# Patient Record
Sex: Female | Born: 1980 | Race: White | Hispanic: No | Smoking: Never smoker
Health system: Southern US, Community
[De-identification: ages and names within clinical notes are randomized; demographics above are authoritative.]

## PROBLEM LIST (undated history)

## (undated) DIAGNOSIS — F32A Depression, unspecified: Secondary | ICD-10-CM

## (undated) DIAGNOSIS — D259 Leiomyoma of uterus, unspecified: Secondary | ICD-10-CM

## (undated) HISTORY — DX: Depression, unspecified: F32.A

---

## 2016-11-02 DIAGNOSIS — T7840XA Allergy, unspecified, initial encounter: Secondary | ICD-10-CM | POA: Insufficient documentation

## 2016-11-02 DIAGNOSIS — J329 Chronic sinusitis, unspecified: Secondary | ICD-10-CM | POA: Insufficient documentation

## 2018-05-19 DIAGNOSIS — H532 Diplopia: Secondary | ICD-10-CM | POA: Insufficient documentation

## 2018-10-19 DIAGNOSIS — N939 Abnormal uterine and vaginal bleeding, unspecified: Secondary | ICD-10-CM | POA: Insufficient documentation

## 2018-11-03 HISTORY — PX: MYOMECTOMY: SHX85

## 2018-11-21 DIAGNOSIS — F419 Anxiety disorder, unspecified: Secondary | ICD-10-CM | POA: Diagnosis not present

## 2018-11-28 DIAGNOSIS — F419 Anxiety disorder, unspecified: Secondary | ICD-10-CM | POA: Diagnosis not present

## 2018-12-08 DIAGNOSIS — Z23 Encounter for immunization: Secondary | ICD-10-CM | POA: Diagnosis not present

## 2018-12-21 DIAGNOSIS — F419 Anxiety disorder, unspecified: Secondary | ICD-10-CM | POA: Diagnosis not present

## 2018-12-25 DIAGNOSIS — M9902 Segmental and somatic dysfunction of thoracic region: Secondary | ICD-10-CM | POA: Diagnosis not present

## 2018-12-25 DIAGNOSIS — M9903 Segmental and somatic dysfunction of lumbar region: Secondary | ICD-10-CM | POA: Diagnosis not present

## 2018-12-25 DIAGNOSIS — M9905 Segmental and somatic dysfunction of pelvic region: Secondary | ICD-10-CM | POA: Diagnosis not present

## 2018-12-25 DIAGNOSIS — M62830 Muscle spasm of back: Secondary | ICD-10-CM | POA: Diagnosis not present

## 2018-12-29 DIAGNOSIS — M9902 Segmental and somatic dysfunction of thoracic region: Secondary | ICD-10-CM | POA: Diagnosis not present

## 2018-12-29 DIAGNOSIS — M9903 Segmental and somatic dysfunction of lumbar region: Secondary | ICD-10-CM | POA: Diagnosis not present

## 2018-12-29 DIAGNOSIS — M9905 Segmental and somatic dysfunction of pelvic region: Secondary | ICD-10-CM | POA: Diagnosis not present

## 2018-12-29 DIAGNOSIS — M62830 Muscle spasm of back: Secondary | ICD-10-CM | POA: Diagnosis not present

## 2019-01-08 DIAGNOSIS — R7989 Other specified abnormal findings of blood chemistry: Secondary | ICD-10-CM | POA: Diagnosis not present

## 2019-01-08 DIAGNOSIS — J329 Chronic sinusitis, unspecified: Secondary | ICD-10-CM | POA: Diagnosis not present

## 2019-01-14 ENCOUNTER — Emergency Department (HOSPITAL_COMMUNITY)
Admission: EM | Admit: 2019-01-14 | Discharge: 2019-01-15 | Disposition: A | Discharge status: Home / Self Care | Payer: BC Managed Care – PPO | Attending: Emergency Medicine | Admitting: Emergency Medicine

## 2019-01-14 DIAGNOSIS — Z79899 Other long term (current) drug therapy: Secondary | ICD-10-CM | POA: Diagnosis not present

## 2019-01-14 DIAGNOSIS — N939 Abnormal uterine and vaginal bleeding, unspecified: Secondary | ICD-10-CM | POA: Insufficient documentation

## 2019-01-14 HISTORY — DX: Leiomyoma of uterus, unspecified: D25.9

## 2019-01-15 ENCOUNTER — Other Ambulatory Visit: Payer: Self-pay

## 2019-01-15 ENCOUNTER — Encounter (HOSPITAL_COMMUNITY): Payer: Self-pay | Admitting: Emergency Medicine

## 2019-01-15 LAB — CBC WITH DIFFERENTIAL/PLATELET
Abs Immature Granulocytes: 0.01 10*3/uL (ref 0.00–0.07)
Basophils Absolute: 0 10*3/uL (ref 0.0–0.1)
Basophils Relative: 1 %
Eosinophils Absolute: 0 10*3/uL (ref 0.0–0.5)
Eosinophils Relative: 0 %
HCT: 32 % — ABNORMAL LOW (ref 36.0–46.0)
Hemoglobin: 10.6 g/dL — ABNORMAL LOW (ref 12.0–15.0)
Immature Granulocytes: 0 %
Lymphocytes Relative: 46 %
Lymphs Abs: 3.8 10*3/uL (ref 0.7–4.0)
MCH: 30.9 pg (ref 26.0–34.0)
MCHC: 33.1 g/dL (ref 30.0–36.0)
MCV: 93.3 fL (ref 80.0–100.0)
Monocytes Absolute: 0.7 10*3/uL (ref 0.1–1.0)
Monocytes Relative: 8 %
Neutro Abs: 3.6 10*3/uL (ref 1.7–7.7)
Neutrophils Relative %: 45 %
Platelets: 299 10*3/uL (ref 150–400)
RBC: 3.43 MIL/uL — ABNORMAL LOW (ref 3.87–5.11)
RDW: 12.6 % (ref 11.5–15.5)
WBC: 8.1 10*3/uL (ref 4.0–10.5)
nRBC: 0 % (ref 0.0–0.2)

## 2019-01-15 LAB — BASIC METABOLIC PANEL
Anion gap: 9 (ref 5–15)
BUN: 15 mg/dL (ref 6–20)
CO2: 22 mmol/L (ref 22–32)
Calcium: 8.7 mg/dL — ABNORMAL LOW (ref 8.9–10.3)
Chloride: 107 mmol/L (ref 98–111)
Creatinine, Ser: 0.8 mg/dL (ref 0.44–1.00)
GFR calc Af Amer: >60 mL/min (ref >60)
GFR calc non Af Amer: >60 mL/min (ref >60)
Glucose, Bld: 125 mg/dL — ABNORMAL HIGH (ref 70–99)
Potassium: 3.7 mmol/L (ref 3.5–5.1)
Sodium: 138 mmol/L (ref 135–145)

## 2019-01-15 LAB — ABO/RH: ABO/RH(D): A POS

## 2019-01-15 LAB — TYPE AND SCREEN
ABO/RH(D): A POS
Antibody Screen: NEGATIVE

## 2019-01-15 LAB — HEMOGLOBIN AND HEMATOCRIT, BLOOD
HCT: 31.9 % — ABNORMAL LOW (ref 36.0–46.0)
Hemoglobin: 10.6 g/dL — ABNORMAL LOW (ref 12.0–15.0)

## 2019-01-15 LAB — I-STAT BETA HCG BLOOD, ED (MC, WL, AP ONLY): I-stat hCG, quantitative: <5 m[IU]/mL (ref <5)

## 2019-01-15 NOTE — ED Triage Notes (Signed)
Per pt she had fibroids removed on August 28th and has been having her period pretty much since then,Pt is now saying she was soaking pads in 5 mins and abdominal pain. Pt said passing huge clots and very big. Pt also having chills

## 2019-01-15 NOTE — ED Notes (Signed)
Pt is shaken as she states that the amt of blood coming out is a lot and is scaring her

## 2019-01-15 NOTE — ED Notes (Signed)
Patient verbalizes understanding of discharge instructions. Opportunity for questioning and answers were provided. Armband removed by staff, pt discharged from ED. Ambulated out to lobby  

## 2019-01-15 NOTE — Discharge Instructions (Addendum)
Please follow up with your OBGYN, as planned. Call their office with any changes.   Return to the emergency department for dizziness, passing out, chest pain, shortness of breath, bleeding through a pad or more an hour, or any other major concerns.

## 2019-01-15 NOTE — ED Provider Notes (Signed)
[facility] EMERGENCY DEPARTMENT Provider Note   CSN: [id number] Arrival date & time: 01/14/19  2351     History   Chief Complaint Chief Complaint  Patient presents with  . Metrorrhagia    fibroids    HPI Kristy Benton is a 38 y.o. female.     HPI   Kristy Benton is a 38 y.o. female, with a history of uterine fibroids and myomectomy, presenting to the ED with vaginal bleeding for the last month, but increased in amount last night. Last night she began soaking a pad in about 5 minutes. Since about 1 AM this morning, the bleeding slowed significantly and then resolved. Bleeding has been accompanied by lower abdominal cramping, but this has resolved. Accompanied by nausea, generalized weakness, and "shakiness," all of which have resolved.  Denies anticoagulation. Underwent fibroid removal Nov 03, 2018 by Dr. Sedalia Muta with [facility]. She has an appointment with Dr. Davina Poke tomorrow.   Denies fever/chills, syncope, vomiting, chest pain, shortness of breath, or any other complaints.     Past Medical History:  Diagnosis Date  . Uterine fibroid     There are no active problems to display for this patient.   Past Surgical History:  Procedure Laterality Date  . MYOMECTOMY  11/03/2018     OB History   No obstetric history on file.      Home Medications    Prior to Admission medications   Medication Sig Start Date End Date Taking? Authorizing Provider  fexofenadine (ALLEGRA) 180 MG tablet Take 180 mg by mouth daily.   Yes [provider]  fluticasone (FLONASE) 50 MCG/ACT nasal spray Place 2 sprays into both nostrils daily.   Yes [provider]  montelukast (SINGULAIR) 10 MG tablet Take 10 mg by mouth at bedtime.   Yes [provider]  norgestimate-ethinyl estradiol (ORTHO-CYCLEN) 0.25-35 MG-MCG tablet Take 1 tablet by mouth daily.   Yes [provider]    Family History History reviewed. No pertinent  family history.  Social History Social History   Tobacco Use  . Smoking status: Never Smoker  . Smokeless tobacco: Never Used  Substance Use Topics  . Alcohol use: Not on file  . Drug use: Not on file     Allergies   Amoxicillin   Review of Systems Review of Systems  Constitutional: Positive for fatigue. Negative for chills, diaphoresis and fever.  Respiratory: Negative for shortness of breath.   Cardiovascular: Negative for chest pain.  Gastrointestinal: Positive for abdominal pain (resolved) and nausea (resolved). Negative for diarrhea and vomiting.  Genitourinary: Positive for vaginal bleeding.  Neurological: Positive for weakness (resolved). Negative for syncope.  All other systems reviewed and are negative.    Physical Exam Updated Vital Signs BP 104/71 (BP Location: Left Arm)   Pulse 69   Temp 98.4 F (36.9 C) (Oral)   Resp 18   SpO2 98%   Physical Exam Vitals signs and nursing note reviewed.  Constitutional:      General: She is not in acute distress.    Appearance: She is well-developed. She is not diaphoretic.  HENT:     Head: Normocephalic and atraumatic.     Mouth/Throat:     Mouth: Mucous membranes are moist.     Pharynx: Oropharynx is clear.  Eyes:     Conjunctiva/sclera: Conjunctivae normal.  Neck:     Musculoskeletal: Neck supple.  Cardiovascular:     Rate and Rhythm: Normal rate and regular rhythm.  Pulses: Normal pulses.          Radial pulses are 2+ on the right side and 2+ on the left side.       Posterior tibial pulses are 2+ on the right side and 2+ on the left side.     Heart sounds: Normal heart sounds.     Comments: Tactile temperature in the extremities appropriate and equal bilaterally. Pulmonary:     Effort: Pulmonary effort is normal. No respiratory distress.     Breath sounds: Normal breath sounds.  Abdominal:     Palpations: Abdomen is soft.     Tenderness: There is no abdominal tenderness. There is no guarding.   Musculoskeletal:     Right lower leg: No edema.     Left lower leg: No edema.  Lymphadenopathy:     Cervical: No cervical adenopathy.  Skin:    General: Skin is warm and dry.  Neurological:     Mental Status: She is alert.  Psychiatric:        Mood and Affect: Mood and affect normal.        Speech: Speech normal.        Behavior: Behavior normal.      ED Treatments / Results  Labs (all labs ordered are listed, but only abnormal results are displayed) Labs Reviewed  CBC WITH DIFFERENTIAL/PLATELET - Abnormal; Notable for the following components:      Result Value   RBC 3.43 (*)    Hemoglobin 10.6 (*)    HCT 32.0 (*)    All other components within normal limits  BASIC METABOLIC PANEL - Abnormal; Notable for the following components:   Glucose, Bld 125 (*)    Calcium 8.7 (*)    All other components within normal limits  HEMOGLOBIN AND HEMATOCRIT, BLOOD - Abnormal; Notable for the following components:   Hemoglobin 10.6 (*)    HCT 31.9 (*)    All other components within normal limits  I-STAT BETA HCG BLOOD, ED ([facility], [facility], [facility] ONLY)  TYPE AND SCREEN  ABO/RH    EKG None  Radiology No results found.  Procedures Procedures (including critical care time)  Medications Ordered in ED Medications - No data to display   Initial Impression / Assessment and Plan / ED Course  I have reviewed the triage vital signs and the nursing notes.  Pertinent labs & imaging results that were available during my care of the patient were reviewed by me and considered in my medical decision making (see chart for details).  Clinical Course as of Jan 14 1030  [facility] Jan 15, 2019  0857 Noted to be 12.8 on Oct 19, 2018 through [place].  Hemoglobin(!): 10.6 [SJ]  0954 Spoke with Maurine, RN with Dr. Vivia Ewing office (patient's OBGYN). Dr. Davina Poke does not come in until around 1 pm today.  She states she will forward the information about the patient's visit to the provider team.    [SJ]     Clinical Course User Index [SJ] Joy, Shawn C, PA-C       Patient presents with vaginal bleeding over the last month, worse last night. Patient is nontoxic appearing, afebrile, not tachycardic, not tachypneic, not hypotensive, maintains excellent SPO2 on room air, and is in no apparent distress.  Pregnancy test negative. Hemoglobin 10.6 today, previously 12.8, however, patient is currently asymptomatic. I explained the purpose of pelvic exam to the patient, but she declined. She has close follow-up with OB/GYN already scheduled. Return precautions discussed.  Patient voices understanding of these instructions, accepts the plan, and is comfortable with discharge.  Findings and plan of care discussed with Octaviano Glow, MD.   Vitals:   01/15/19 0845 01/15/19 0900 01/15/19 0915 01/15/19 0943  BP: 106/68 117/70 120/64 107/65  Pulse: 62 72 79 70  Resp:    17  Temp:    98.7 F (37.1 C)  TempSrc:    Oral  SpO2: 99% 100% 100% 100%     Final Clinical Impressions(s) / ED Diagnoses   Final diagnoses:  Vaginal bleeding    ED Discharge Orders    None       Layla Maw 01/15/19 1033    Wyvonnia Dusky, MD 01/17/19 1217

## 2019-01-16 DIAGNOSIS — D259 Leiomyoma of uterus, unspecified: Secondary | ICD-10-CM | POA: Diagnosis not present

## 2019-01-16 DIAGNOSIS — N939 Abnormal uterine and vaginal bleeding, unspecified: Secondary | ICD-10-CM | POA: Diagnosis not present

## 2019-02-05 DIAGNOSIS — Z20828 Contact with and (suspected) exposure to other viral communicable diseases: Secondary | ICD-10-CM | POA: Diagnosis not present

## 2019-02-09 DIAGNOSIS — N8501 Benign endometrial hyperplasia: Secondary | ICD-10-CM | POA: Diagnosis not present

## 2019-02-09 DIAGNOSIS — N938 Other specified abnormal uterine and vaginal bleeding: Secondary | ICD-10-CM | POA: Diagnosis not present

## 2019-02-09 DIAGNOSIS — N939 Abnormal uterine and vaginal bleeding, unspecified: Secondary | ICD-10-CM | POA: Diagnosis not present

## 2019-02-09 DIAGNOSIS — D259 Leiomyoma of uterus, unspecified: Secondary | ICD-10-CM | POA: Diagnosis not present

## 2019-02-09 DIAGNOSIS — N8500 Endometrial hyperplasia, unspecified: Secondary | ICD-10-CM | POA: Diagnosis not present

## 2019-02-09 DIAGNOSIS — D250 Submucous leiomyoma of uterus: Secondary | ICD-10-CM | POA: Diagnosis not present

## 2019-02-09 DIAGNOSIS — N72 Inflammatory disease of cervix uteri: Secondary | ICD-10-CM | POA: Diagnosis not present

## 2019-02-10 DIAGNOSIS — D259 Leiomyoma of uterus, unspecified: Secondary | ICD-10-CM | POA: Diagnosis not present

## 2019-02-10 DIAGNOSIS — N939 Abnormal uterine and vaginal bleeding, unspecified: Secondary | ICD-10-CM | POA: Diagnosis not present

## 2019-02-14 DIAGNOSIS — F419 Anxiety disorder, unspecified: Secondary | ICD-10-CM | POA: Diagnosis not present

## 2019-03-05 DIAGNOSIS — M9903 Segmental and somatic dysfunction of lumbar region: Secondary | ICD-10-CM | POA: Diagnosis not present

## 2019-03-05 DIAGNOSIS — M62830 Muscle spasm of back: Secondary | ICD-10-CM | POA: Diagnosis not present

## 2019-03-05 DIAGNOSIS — M9902 Segmental and somatic dysfunction of thoracic region: Secondary | ICD-10-CM | POA: Diagnosis not present

## 2019-03-05 DIAGNOSIS — M9905 Segmental and somatic dysfunction of pelvic region: Secondary | ICD-10-CM | POA: Diagnosis not present

## 2019-03-08 DIAGNOSIS — M9905 Segmental and somatic dysfunction of pelvic region: Secondary | ICD-10-CM | POA: Diagnosis not present

## 2019-03-08 DIAGNOSIS — M9902 Segmental and somatic dysfunction of thoracic region: Secondary | ICD-10-CM | POA: Diagnosis not present

## 2019-03-08 DIAGNOSIS — M62830 Muscle spasm of back: Secondary | ICD-10-CM | POA: Diagnosis not present

## 2019-03-08 DIAGNOSIS — M9903 Segmental and somatic dysfunction of lumbar region: Secondary | ICD-10-CM | POA: Diagnosis not present

## 2019-06-12 DIAGNOSIS — D225 Melanocytic nevi of trunk: Secondary | ICD-10-CM | POA: Diagnosis not present

## 2019-06-12 DIAGNOSIS — D2272 Melanocytic nevi of left lower limb, including hip: Secondary | ICD-10-CM | POA: Diagnosis not present

## 2019-06-12 DIAGNOSIS — L7211 Pilar cyst: Secondary | ICD-10-CM | POA: Diagnosis not present

## 2019-06-12 DIAGNOSIS — L718 Other rosacea: Secondary | ICD-10-CM | POA: Diagnosis not present

## 2019-06-25 DIAGNOSIS — J3089 Other allergic rhinitis: Secondary | ICD-10-CM | POA: Diagnosis not present

## 2019-06-25 DIAGNOSIS — H1045 Other chronic allergic conjunctivitis: Secondary | ICD-10-CM | POA: Diagnosis not present

## 2019-06-25 DIAGNOSIS — J301 Allergic rhinitis due to pollen: Secondary | ICD-10-CM | POA: Diagnosis not present

## 2019-10-22 DIAGNOSIS — Z0000 Encounter for general adult medical examination without abnormal findings: Secondary | ICD-10-CM | POA: Diagnosis not present

## 2019-10-22 DIAGNOSIS — R350 Frequency of micturition: Secondary | ICD-10-CM | POA: Diagnosis not present

## 2019-10-22 DIAGNOSIS — F419 Anxiety disorder, unspecified: Secondary | ICD-10-CM | POA: Diagnosis not present

## 2019-10-22 DIAGNOSIS — F329 Major depressive disorder, single episode, unspecified: Secondary | ICD-10-CM | POA: Diagnosis not present

## 2019-10-24 DIAGNOSIS — R350 Frequency of micturition: Secondary | ICD-10-CM | POA: Diagnosis not present

## 2019-11-09 DIAGNOSIS — D2271 Melanocytic nevi of right lower limb, including hip: Secondary | ICD-10-CM | POA: Diagnosis not present

## 2019-11-09 DIAGNOSIS — L718 Other rosacea: Secondary | ICD-10-CM | POA: Diagnosis not present

## 2019-11-22 DIAGNOSIS — F419 Anxiety disorder, unspecified: Secondary | ICD-10-CM | POA: Diagnosis not present

## 2019-12-18 DIAGNOSIS — F419 Anxiety disorder, unspecified: Secondary | ICD-10-CM | POA: Diagnosis not present

## 2019-12-24 DIAGNOSIS — F419 Anxiety disorder, unspecified: Secondary | ICD-10-CM | POA: Diagnosis not present

## 2020-02-15 DIAGNOSIS — L0109 Other impetigo: Secondary | ICD-10-CM | POA: Diagnosis not present

## 2020-02-15 DIAGNOSIS — D1801 Hemangioma of skin and subcutaneous tissue: Secondary | ICD-10-CM | POA: Diagnosis not present

## 2020-04-04 DIAGNOSIS — D485 Neoplasm of uncertain behavior of skin: Secondary | ICD-10-CM | POA: Diagnosis not present

## 2020-04-04 DIAGNOSIS — L298 Other pruritus: Secondary | ICD-10-CM | POA: Diagnosis not present

## 2020-04-21 DIAGNOSIS — R2231 Localized swelling, mass and lump, right upper limb: Secondary | ICD-10-CM | POA: Diagnosis not present

## 2020-04-21 DIAGNOSIS — M79644 Pain in right finger(s): Secondary | ICD-10-CM | POA: Insufficient documentation

## 2020-04-23 ENCOUNTER — Other Ambulatory Visit: Payer: Self-pay | Admitting: Orthopedic Surgery

## 2020-04-23 DIAGNOSIS — R2231 Localized swelling, mass and lump, right upper limb: Secondary | ICD-10-CM

## 2020-04-24 DIAGNOSIS — Z1231 Encounter for screening mammogram for malignant neoplasm of breast: Secondary | ICD-10-CM | POA: Diagnosis not present

## 2020-05-02 ENCOUNTER — Ambulatory Visit
Admission: RE | Admit: 2020-05-02 | Discharge: 2020-05-02 | Disposition: A | Discharge status: Home / Self Care | Payer: BC Managed Care – PPO | Source: Ambulatory Visit | Attending: Orthopedic Surgery | Admitting: Orthopedic Surgery

## 2020-05-02 ENCOUNTER — Other Ambulatory Visit: Payer: Self-pay

## 2020-05-02 DIAGNOSIS — R2231 Localized swelling, mass and lump, right upper limb: Secondary | ICD-10-CM

## 2020-05-07 DIAGNOSIS — R2231 Localized swelling, mass and lump, right upper limb: Secondary | ICD-10-CM | POA: Diagnosis not present

## 2020-05-29 DIAGNOSIS — Z01419 Encounter for gynecological examination (general) (routine) without abnormal findings: Secondary | ICD-10-CM | POA: Diagnosis not present

## 2020-05-30 DIAGNOSIS — R2231 Localized swelling, mass and lump, right upper limb: Secondary | ICD-10-CM | POA: Diagnosis not present

## 2020-06-10 DIAGNOSIS — J3089 Other allergic rhinitis: Secondary | ICD-10-CM | POA: Diagnosis not present

## 2020-06-10 DIAGNOSIS — H1045 Other chronic allergic conjunctivitis: Secondary | ICD-10-CM | POA: Diagnosis not present

## 2020-06-10 DIAGNOSIS — J301 Allergic rhinitis due to pollen: Secondary | ICD-10-CM | POA: Diagnosis not present

## 2020-08-19 DIAGNOSIS — D485 Neoplasm of uncertain behavior of skin: Secondary | ICD-10-CM | POA: Diagnosis not present

## 2020-08-19 DIAGNOSIS — B078 Other viral warts: Secondary | ICD-10-CM | POA: Diagnosis not present

## 2020-08-19 DIAGNOSIS — L7211 Pilar cyst: Secondary | ICD-10-CM | POA: Diagnosis not present

## 2020-08-19 DIAGNOSIS — D2262 Melanocytic nevi of left upper limb, including shoulder: Secondary | ICD-10-CM | POA: Diagnosis not present

## 2020-08-19 DIAGNOSIS — L812 Freckles: Secondary | ICD-10-CM | POA: Diagnosis not present

## 2020-08-19 DIAGNOSIS — D2261 Melanocytic nevi of right upper limb, including shoulder: Secondary | ICD-10-CM | POA: Diagnosis not present

## 2020-08-21 DIAGNOSIS — M542 Cervicalgia: Secondary | ICD-10-CM | POA: Diagnosis not present

## 2020-08-25 DIAGNOSIS — M542 Cervicalgia: Secondary | ICD-10-CM | POA: Diagnosis not present

## 2020-08-27 DIAGNOSIS — F32A Depression, unspecified: Secondary | ICD-10-CM | POA: Diagnosis not present

## 2020-08-27 DIAGNOSIS — F330 Major depressive disorder, recurrent, mild: Secondary | ICD-10-CM | POA: Diagnosis not present

## 2020-08-27 DIAGNOSIS — F419 Anxiety disorder, unspecified: Secondary | ICD-10-CM | POA: Diagnosis not present

## 2020-09-01 DIAGNOSIS — H9202 Otalgia, left ear: Secondary | ICD-10-CM | POA: Diagnosis not present

## 2020-09-02 DIAGNOSIS — H60392 Other infective otitis externa, left ear: Secondary | ICD-10-CM | POA: Diagnosis not present

## 2020-09-09 DIAGNOSIS — M542 Cervicalgia: Secondary | ICD-10-CM | POA: Diagnosis not present

## 2020-09-25 DIAGNOSIS — J329 Chronic sinusitis, unspecified: Secondary | ICD-10-CM | POA: Diagnosis not present

## 2020-10-09 DIAGNOSIS — M542 Cervicalgia: Secondary | ICD-10-CM | POA: Diagnosis not present

## 2020-10-17 DIAGNOSIS — M542 Cervicalgia: Secondary | ICD-10-CM | POA: Diagnosis not present

## 2020-10-21 ENCOUNTER — Other Ambulatory Visit: Payer: Self-pay

## 2020-10-21 ENCOUNTER — Encounter: Payer: Self-pay | Admitting: Internal Medicine

## 2020-10-21 ENCOUNTER — Ambulatory Visit (INDEPENDENT_AMBULATORY_CARE_PROVIDER_SITE_OTHER): Payer: BC Managed Care – PPO | Admitting: Internal Medicine

## 2020-10-21 VITALS — BP 110/70 | HR 63 | Temp 98.0°F | Ht 68.0 in | Wt 142.4 lb

## 2020-10-21 DIAGNOSIS — F339 Major depressive disorder, recurrent, unspecified: Secondary | ICD-10-CM | POA: Diagnosis not present

## 2020-10-21 DIAGNOSIS — Z0000 Encounter for general adult medical examination without abnormal findings: Secondary | ICD-10-CM

## 2020-10-21 LAB — COMPREHENSIVE METABOLIC PANEL
ALT: 23 U/L (ref 0–35)
AST: 20 U/L (ref 0–37)
Albumin: 4.4 g/dL (ref 3.5–5.2)
Alkaline Phosphatase: 61 U/L (ref 39–117)
BUN: 14 mg/dL (ref 6–23)
CO2: 27 mEq/L (ref 19–32)
Calcium: 9.1 mg/dL (ref 8.4–10.5)
Chloride: 106 mEq/L (ref 96–112)
Creatinine, Ser: 0.85 mg/dL (ref 0.40–1.20)
GFR: 85.32 mL/min (ref >60.00)
Glucose, Bld: 83 mg/dL (ref 70–99)
Potassium: 4.2 mEq/L (ref 3.5–5.1)
Sodium: 140 mEq/L (ref 135–145)
Total Bilirubin: 0.6 mg/dL (ref 0.2–1.2)
Total Protein: 6.7 g/dL (ref 6.0–8.3)

## 2020-10-21 LAB — LIPID PANEL
Cholesterol: 163 mg/dL (ref 0–200)
HDL: 64.8 mg/dL (ref >39.00)
LDL Cholesterol: 90 mg/dL (ref 0–99)
NonHDL: 98.63
Total CHOL/HDL Ratio: 3
Triglycerides: 44 mg/dL (ref 0.0–149.0)
VLDL: 8.8 mg/dL (ref 0.0–40.0)

## 2020-10-21 LAB — CBC WITH DIFFERENTIAL/PLATELET
Basophils Absolute: 0.1 10*3/uL (ref 0.0–0.1)
Basophils Relative: 1.2 % (ref 0.0–3.0)
Eosinophils Absolute: 0 10*3/uL (ref 0.0–0.7)
Eosinophils Relative: 0.2 % (ref 0.0–5.0)
HCT: 38.6 % (ref 36.0–46.0)
Hemoglobin: 13 g/dL (ref 12.0–15.0)
Lymphocytes Relative: 34.8 % (ref 12.0–46.0)
Lymphs Abs: 1.5 10*3/uL (ref 0.7–4.0)
MCHC: 33.6 g/dL (ref 30.0–36.0)
MCV: 92.6 fl (ref 78.0–100.0)
Monocytes Absolute: 0.3 10*3/uL (ref 0.1–1.0)
Monocytes Relative: 7.3 % (ref 3.0–12.0)
Neutro Abs: 2.5 10*3/uL (ref 1.4–7.7)
Neutrophils Relative %: 56.5 % (ref 43.0–77.0)
Platelets: 264 10*3/uL (ref 150.0–400.0)
RBC: 4.17 Mil/uL (ref 3.87–5.11)
RDW: 13.9 % (ref 11.5–15.5)
WBC: 4.4 10*3/uL (ref 4.0–10.5)

## 2020-10-21 LAB — VITAMIN B12: Vitamin B-12: 256 pg/mL (ref 211–911)

## 2020-10-21 LAB — VITAMIN D 25 HYDROXY (VIT D DEFICIENCY, FRACTURES): VITD: 46.5 ng/mL (ref 30.00–100.00)

## 2020-10-21 LAB — HEMOGLOBIN A1C: Hgb A1c MFr Bld: 5.3 % (ref 4.6–6.5)

## 2020-10-21 LAB — TSH: TSH: 1.66 u[IU]/mL (ref 0.35–5.50)

## 2020-10-21 NOTE — Patient Instructions (Signed)
-  Nice seeing you today!!  -Lab work today; will notify you once results are available.  -Consider getting your COVID booster.  -Schedule follow up in 1 year or sooner as needed.

## 2020-10-21 NOTE — Progress Notes (Signed)
Established Patient Office Visit     This visit occurred during the SARS-CoV-2 public health emergency.  Safety protocols were in place, including screening questions prior to the visit, additional usage of staff PPE, and extensive cleaning of exam room while observing appropriate contact time as indicated for disinfecting solutions.    CC/Reason for Visit: Establish care, annual preventive exam  HPI: Kristy Benton is a 40 y.o. female who is coming in today for the above mentioned reasons. Past Medical History is significant for: Depression with stable mood on Wellbutrin.  Allergies followed by local allergist on Allegra, Singulair, Flonase.  She had a hysterectomy in December 2020 due to dysfunctional uterine bleeding.  She has routine eye and dental care.  She exercises routinely.  She works as a Psychologist, forensic for [employer].  She has a 78-year-old daughter.  She does not smoke, she drinks only occasionally.  She has allergies to amoxicillin which cause hives, her family history significant for paternal grandfather with coronary artery disease.  She has no acute concerns today.  She had a negative mammogram in February.  She had 1 [employer] COVID-vaccine, not yet boosted.   Past Medical/Surgical History: Past Medical History:  Diagnosis Date   Depression    Uterine fibroid     Past Surgical History:  Procedure Laterality Date   MYOMECTOMY  11/03/2018    Social History:  reports that she has never smoked. She has never used smokeless tobacco. She reports current alcohol use. She reports that she does not use drugs.  Allergies: Allergies  Allergen Reactions   Amoxicillin Hives    Family History:  Family History  Problem Relation Age of Onset   Coronary artery disease Paternal Grandfather      Current Outpatient Medications:    buPROPion (WELLBUTRIN XL) 150 MG 24 hr tablet, Take 150 mg by mouth daily., Disp: , Rfl:    Cholecalciferol (VITAMIN D) 50 MCG  (2000 UT) tablet, Take 2,000 Units by mouth daily., Disp: , Rfl:    ferrous sulfate 325 (65 FE) MG tablet, Take 325 mg by mouth every other day., Disp: , Rfl:    fexofenadine (ALLEGRA) 180 MG tablet, Take 180 mg by mouth daily., Disp: , Rfl:    fluticasone (FLONASE) 50 MCG/ACT nasal spray, Place 2 sprays into both nostrils daily., Disp: , Rfl:    montelukast (SINGULAIR) 10 MG tablet, Take 10 mg by mouth at bedtime., Disp: , Rfl:    Probiotic Product (PROBIOTIC-10 PO), Take by mouth., Disp: , Rfl:   Review of Systems:  Constitutional: Denies fever, chills, diaphoresis, appetite change and fatigue.  HEENT: Denies photophobia, eye pain, redness, hearing loss, ear pain, congestion, sore throat, rhinorrhea, sneezing, mouth sores, trouble swallowing, neck pain, neck stiffness and tinnitus.   Respiratory: Denies SOB, DOE, cough, chest tightness,  and wheezing.   Cardiovascular: Denies chest pain, palpitations and leg swelling.  Gastrointestinal: Denies nausea, vomiting, abdominal pain, diarrhea, constipation, blood in stool and abdominal distention.  Genitourinary: Denies dysuria, urgency, frequency, hematuria, flank pain and difficulty urinating.  Endocrine: Denies: hot or cold intolerance, sweats, changes in hair or nails, polyuria, polydipsia. Musculoskeletal: Denies myalgias, back pain, joint swelling, arthralgias and gait problem.  Skin: Denies pallor, rash and wound.  Neurological: Denies dizziness, seizures, syncope, weakness, light-headedness, numbness and headaches.  Hematological: Denies adenopathy. Easy bruising, personal or family bleeding history  Psychiatric/Behavioral: Denies suicidal ideation, mood changes, confusion, nervousness, sleep disturbance and agitation    Physical Exam: Vitals:  10/21/20 0807  BP: 110/70  Pulse: 63  Temp: 98 F (36.7 C)  TempSrc: Oral  SpO2: 99%  Weight: 142 lb 6.4 oz (64.6 kg)  Height: '5\' 8"'$  (1.727 m)    Body mass index is 21.65  kg/m.   Constitutional: NAD, calm, comfortable Eyes: PERRL, lids and conjunctivae normal, wears corrective lenses ENMT: Mucous membranes are moist. Posterior pharynx clear of any exudate or lesions. Normal dentition. Tympanic membrane is pearly white, no erythema or bulging. Neck: normal, supple, no masses, no thyromegaly Respiratory: clear to auscultation bilaterally, no wheezing, no crackles. Normal respiratory effort. No accessory muscle use.  Cardiovascular: Regular rate and rhythm, no murmurs / rubs / gallops. No extremity edema. 2+ pedal pulses. No carotid bruits.  Abdomen: no tenderness, no masses palpated. No hepatosplenomegaly. Bowel sounds positive.  Musculoskeletal: no clubbing / cyanosis. No joint deformity upper and lower extremities. Good ROM, no contractures. Normal muscle tone.  Skin: no rashes, lesions, ulcers. No induration Neurologic: CN 2-12 grossly intact. Sensation intact, DTR normal. Strength 5/5 in all 4.  Psychiatric: Normal judgment and insight. Alert and oriented x 3. Normal mood.    Impression and Plan:  Encounter for preventive health examination -Advised routine eye and dental care. -All immunizations are up-to-date with exception of a COVID booster which she declines. -Pending labs today. -Healthy lifestyle discussed in detail. -She had a negative mammogram in February 2022. -Commence routine colon cancer screening age 49. -She follows with GYN for cervical cancer screening.  Depression, recurrent (Talent)   [place] Office Visit from 10/21/2020 in [place] at [facility]  PHQ-9 Total Score 0      -Mood is stable on Wellbutrin 150 mg.   Patient Instructions  -Nice seeing you today!!  -Lab work today; will notify you once results are available.  -Consider getting your COVID booster.  -Schedule follow up in 1 year or sooner as needed.     Lelon Frohlich, MD [facility]

## 2020-10-31 DIAGNOSIS — H5203 Hypermetropia, bilateral: Secondary | ICD-10-CM | POA: Diagnosis not present

## 2020-10-31 DIAGNOSIS — H52203 Unspecified astigmatism, bilateral: Secondary | ICD-10-CM | POA: Diagnosis not present

## 2020-11-06 DIAGNOSIS — M542 Cervicalgia: Secondary | ICD-10-CM | POA: Diagnosis not present

## 2020-11-24 DIAGNOSIS — M542 Cervicalgia: Secondary | ICD-10-CM | POA: Diagnosis not present

## 2020-12-12 DIAGNOSIS — M542 Cervicalgia: Secondary | ICD-10-CM | POA: Diagnosis not present

## 2021-01-15 DIAGNOSIS — J209 Acute bronchitis, unspecified: Secondary | ICD-10-CM | POA: Diagnosis not present

## 2021-01-16 DIAGNOSIS — R2231 Localized swelling, mass and lump, right upper limb: Secondary | ICD-10-CM | POA: Diagnosis not present

## 2021-01-19 DIAGNOSIS — M542 Cervicalgia: Secondary | ICD-10-CM | POA: Diagnosis not present

## 2021-01-19 DIAGNOSIS — B078 Other viral warts: Secondary | ICD-10-CM | POA: Diagnosis not present

## 2021-01-19 DIAGNOSIS — D1801 Hemangioma of skin and subcutaneous tissue: Secondary | ICD-10-CM | POA: Diagnosis not present

## 2021-01-19 DIAGNOSIS — D2271 Melanocytic nevi of right lower limb, including hip: Secondary | ICD-10-CM | POA: Diagnosis not present

## 2021-01-26 DIAGNOSIS — M542 Cervicalgia: Secondary | ICD-10-CM | POA: Diagnosis not present

## 2021-02-02 DIAGNOSIS — R2230 Localized swelling, mass and lump, unspecified upper limb: Secondary | ICD-10-CM | POA: Diagnosis not present

## 2021-02-06 DIAGNOSIS — M542 Cervicalgia: Secondary | ICD-10-CM | POA: Diagnosis not present

## 2021-02-10 DIAGNOSIS — J3089 Other allergic rhinitis: Secondary | ICD-10-CM | POA: Diagnosis not present

## 2021-02-10 DIAGNOSIS — H1045 Other chronic allergic conjunctivitis: Secondary | ICD-10-CM | POA: Diagnosis not present

## 2021-02-10 DIAGNOSIS — J301 Allergic rhinitis due to pollen: Secondary | ICD-10-CM | POA: Diagnosis not present

## 2021-02-13 DIAGNOSIS — M542 Cervicalgia: Secondary | ICD-10-CM | POA: Diagnosis not present

## 2021-02-16 DIAGNOSIS — J301 Allergic rhinitis due to pollen: Secondary | ICD-10-CM | POA: Diagnosis not present

## 2021-02-17 DIAGNOSIS — J3089 Other allergic rhinitis: Secondary | ICD-10-CM | POA: Diagnosis not present

## 2021-02-20 DIAGNOSIS — B079 Viral wart, unspecified: Secondary | ICD-10-CM | POA: Diagnosis not present

## 2021-02-20 DIAGNOSIS — M542 Cervicalgia: Secondary | ICD-10-CM | POA: Diagnosis not present

## 2021-02-20 DIAGNOSIS — D485 Neoplasm of uncertain behavior of skin: Secondary | ICD-10-CM | POA: Diagnosis not present

## 2021-02-26 ENCOUNTER — Ambulatory Visit (INDEPENDENT_AMBULATORY_CARE_PROVIDER_SITE_OTHER): Payer: BC Managed Care – PPO | Admitting: Internal Medicine

## 2021-02-26 VITALS — BP 110/68 | HR 70 | Temp 98.0°F | Ht 68.0 in | Wt 141.5 lb

## 2021-02-26 DIAGNOSIS — R21 Rash and other nonspecific skin eruption: Secondary | ICD-10-CM | POA: Diagnosis not present

## 2021-02-26 NOTE — Progress Notes (Signed)
Established Patient Office Visit     This visit occurred during the SARS-CoV-2 public health emergency.  Safety protocols were in place, including screening questions prior to the visit, additional usage of staff PPE, and extensive cleaning of exam room while observing appropriate contact time as indicated for disinfecting solutions.    CC/Reason for Visit: Rash  HPI: Roberto Hlavaty is a 40 y.o. female who is coming in today for the above mentioned reasons.  She is here for evaluation of a rash.  The rash is macular, flat, pinkish colored.  She notices that sometimes after exercising they may become darker in color.  They are mainly located over the legs, not on the trunk.  Past Medical/Surgical History: Past Medical History:  Diagnosis Date   Depression    Uterine fibroid     Past Surgical History:  Procedure Laterality Date   MYOMECTOMY  11/03/2018    Social History:  reports that she has never smoked. She has never used smokeless tobacco. She reports current alcohol use. She reports that she does not use drugs.  Allergies: Allergies  Allergen Reactions   Amoxicillin Hives    Family History:  Family History  Problem Relation Age of Onset   Coronary artery disease Paternal Grandfather      Current Outpatient Medications:    buPROPion (WELLBUTRIN XL) 150 MG 24 hr tablet, Take 150 mg by mouth daily., Disp: , Rfl:    fexofenadine (ALLEGRA) 180 MG tablet, Take 180 mg by mouth daily., Disp: , Rfl:    fluticasone (FLONASE) 50 MCG/ACT nasal spray, Place 2 sprays into both nostrils daily., Disp: , Rfl:    montelukast (SINGULAIR) 10 MG tablet, Take 10 mg by mouth at bedtime., Disp: , Rfl:    Probiotic Product (PROBIOTIC-10 PO), Take by mouth., Disp: , Rfl:    Cholecalciferol (VITAMIN D) 50 MCG (2000 UT) tablet, Take 2,000 Units by mouth daily. (Patient not taking: Reported on 02/26/2021), Disp: , Rfl:    ferrous sulfate 325 (65 FE) MG tablet, Take 325 mg by mouth  every other day. (Patient not taking: Reported on 02/26/2021), Disp: , Rfl:   Review of Systems:  Constitutional: Denies fever, chills, diaphoresis, appetite change and fatigue.  HEENT: Denies photophobia, eye pain, redness, hearing loss, ear pain, congestion, sore throat, rhinorrhea, sneezing, mouth sores, trouble swallowing, neck pain, neck stiffness and tinnitus.   Respiratory: Denies SOB, DOE, cough, chest tightness,  and wheezing.   Cardiovascular: Denies chest pain, palpitations and leg swelling.  Gastrointestinal: Denies nausea, vomiting, abdominal pain, diarrhea, constipation, blood in stool and abdominal distention.  Genitourinary: Denies dysuria, urgency, frequency, hematuria, flank pain and difficulty urinating.  Endocrine: Denies: hot or cold intolerance, sweats, changes in hair or nails, polyuria, polydipsia. Musculoskeletal: Denies myalgias, back pain, joint swelling, arthralgias and gait problem.  Skin: Denies pallor and wound.  Neurological: Denies dizziness, seizures, syncope, weakness, light-headedness, numbness and headaches.  Hematological: Denies adenopathy. Easy bruising, personal or family bleeding history  Psychiatric/Behavioral: Denies suicidal ideation, mood changes, confusion, nervousness, sleep disturbance and agitation    Physical Exam: Vitals:   02/26/21 0901  BP: 110/68  Pulse: 70  Temp: 98 F (36.7 C)  TempSrc: Oral  SpO2: 99%  Weight: 141 lb 8 oz (64.2 kg)  Height: 5\' 8"  (1.727 m)    Body mass index is 21.52 kg/m.   Constitutional: NAD, calm, comfortable Eyes: PERRL, lids and conjunctivae normal, wears corrective lenses ENMT: Mucous membranes are moist.  Respiratory: clear to  auscultation bilaterally, no wheezing, no crackles. Normal respiratory effort. No accessory muscle use.  Cardiovascular: Regular rate and rhythm, no murmurs / rubs / gallops.  Skin: Macular areas around [place] Psychiatric: Normal judgment and insight. Alert and oriented x  3. Normal mood.    Impression and Plan:  Rash -Suspect either eczema or pityriasis rosea. -Have advised observation for now, should resolve on its own.  Time spent: 20 minutes reviewing chart, interviewing and examining patient and formulating plan of care.    Lelon Frohlich, MD [facility]

## 2021-03-05 DIAGNOSIS — M542 Cervicalgia: Secondary | ICD-10-CM | POA: Diagnosis not present

## 2021-03-05 DIAGNOSIS — J301 Allergic rhinitis due to pollen: Secondary | ICD-10-CM | POA: Diagnosis not present

## 2021-03-05 DIAGNOSIS — J3089 Other allergic rhinitis: Secondary | ICD-10-CM | POA: Diagnosis not present

## 2021-03-10 DIAGNOSIS — J301 Allergic rhinitis due to pollen: Secondary | ICD-10-CM | POA: Diagnosis not present

## 2021-03-10 DIAGNOSIS — J3089 Other allergic rhinitis: Secondary | ICD-10-CM | POA: Diagnosis not present

## 2021-03-12 DIAGNOSIS — J301 Allergic rhinitis due to pollen: Secondary | ICD-10-CM | POA: Diagnosis not present

## 2021-03-12 DIAGNOSIS — J3089 Other allergic rhinitis: Secondary | ICD-10-CM | POA: Diagnosis not present

## 2021-03-16 DIAGNOSIS — M542 Cervicalgia: Secondary | ICD-10-CM | POA: Diagnosis not present

## 2021-03-17 DIAGNOSIS — J3089 Other allergic rhinitis: Secondary | ICD-10-CM | POA: Diagnosis not present

## 2021-03-17 DIAGNOSIS — J301 Allergic rhinitis due to pollen: Secondary | ICD-10-CM | POA: Diagnosis not present

## 2021-03-19 DIAGNOSIS — J3089 Other allergic rhinitis: Secondary | ICD-10-CM | POA: Diagnosis not present

## 2021-03-19 DIAGNOSIS — J301 Allergic rhinitis due to pollen: Secondary | ICD-10-CM | POA: Diagnosis not present

## 2021-03-23 DIAGNOSIS — J301 Allergic rhinitis due to pollen: Secondary | ICD-10-CM | POA: Diagnosis not present

## 2021-03-23 DIAGNOSIS — J3089 Other allergic rhinitis: Secondary | ICD-10-CM | POA: Diagnosis not present

## 2021-03-25 DIAGNOSIS — J301 Allergic rhinitis due to pollen: Secondary | ICD-10-CM | POA: Diagnosis not present

## 2021-03-25 DIAGNOSIS — J3089 Other allergic rhinitis: Secondary | ICD-10-CM | POA: Diagnosis not present

## 2021-03-30 DIAGNOSIS — J3089 Other allergic rhinitis: Secondary | ICD-10-CM | POA: Diagnosis not present

## 2021-03-30 DIAGNOSIS — J301 Allergic rhinitis due to pollen: Secondary | ICD-10-CM | POA: Diagnosis not present

## 2021-04-01 DIAGNOSIS — J301 Allergic rhinitis due to pollen: Secondary | ICD-10-CM | POA: Diagnosis not present

## 2021-04-01 DIAGNOSIS — M542 Cervicalgia: Secondary | ICD-10-CM | POA: Diagnosis not present

## 2021-04-01 DIAGNOSIS — J3089 Other allergic rhinitis: Secondary | ICD-10-CM | POA: Diagnosis not present

## 2021-04-09 DIAGNOSIS — J3089 Other allergic rhinitis: Secondary | ICD-10-CM | POA: Diagnosis not present

## 2021-04-09 DIAGNOSIS — J301 Allergic rhinitis due to pollen: Secondary | ICD-10-CM | POA: Diagnosis not present

## 2021-04-13 DIAGNOSIS — J301 Allergic rhinitis due to pollen: Secondary | ICD-10-CM | POA: Diagnosis not present

## 2021-04-13 DIAGNOSIS — J3089 Other allergic rhinitis: Secondary | ICD-10-CM | POA: Diagnosis not present

## 2021-04-15 DIAGNOSIS — M542 Cervicalgia: Secondary | ICD-10-CM | POA: Diagnosis not present

## 2021-04-16 DIAGNOSIS — J301 Allergic rhinitis due to pollen: Secondary | ICD-10-CM | POA: Diagnosis not present

## 2021-04-16 DIAGNOSIS — J3089 Other allergic rhinitis: Secondary | ICD-10-CM | POA: Diagnosis not present

## 2021-04-20 DIAGNOSIS — J301 Allergic rhinitis due to pollen: Secondary | ICD-10-CM | POA: Diagnosis not present

## 2021-04-20 DIAGNOSIS — J3081 Allergic rhinitis due to animal (cat) (dog) hair and dander: Secondary | ICD-10-CM | POA: Diagnosis not present

## 2021-04-20 DIAGNOSIS — J3089 Other allergic rhinitis: Secondary | ICD-10-CM | POA: Diagnosis not present

## 2021-04-22 DIAGNOSIS — J3089 Other allergic rhinitis: Secondary | ICD-10-CM | POA: Diagnosis not present

## 2021-04-22 DIAGNOSIS — J301 Allergic rhinitis due to pollen: Secondary | ICD-10-CM | POA: Diagnosis not present

## 2021-04-24 DIAGNOSIS — R2231 Localized swelling, mass and lump, right upper limb: Secondary | ICD-10-CM | POA: Diagnosis not present

## 2021-04-28 DIAGNOSIS — J301 Allergic rhinitis due to pollen: Secondary | ICD-10-CM | POA: Diagnosis not present

## 2021-04-28 DIAGNOSIS — J3089 Other allergic rhinitis: Secondary | ICD-10-CM | POA: Diagnosis not present

## 2021-04-30 DIAGNOSIS — J301 Allergic rhinitis due to pollen: Secondary | ICD-10-CM | POA: Diagnosis not present

## 2021-04-30 DIAGNOSIS — J3089 Other allergic rhinitis: Secondary | ICD-10-CM | POA: Diagnosis not present

## 2021-05-04 DIAGNOSIS — J3089 Other allergic rhinitis: Secondary | ICD-10-CM | POA: Diagnosis not present

## 2021-05-04 DIAGNOSIS — J301 Allergic rhinitis due to pollen: Secondary | ICD-10-CM | POA: Diagnosis not present

## 2021-05-04 DIAGNOSIS — J3081 Allergic rhinitis due to animal (cat) (dog) hair and dander: Secondary | ICD-10-CM | POA: Diagnosis not present

## 2021-05-08 DIAGNOSIS — Z1231 Encounter for screening mammogram for malignant neoplasm of breast: Secondary | ICD-10-CM | POA: Diagnosis not present

## 2021-05-08 LAB — HM MAMMOGRAPHY

## 2021-05-11 DIAGNOSIS — J3089 Other allergic rhinitis: Secondary | ICD-10-CM | POA: Diagnosis not present

## 2021-05-11 DIAGNOSIS — J301 Allergic rhinitis due to pollen: Secondary | ICD-10-CM | POA: Diagnosis not present

## 2021-05-12 ENCOUNTER — Encounter: Payer: Self-pay | Admitting: Internal Medicine

## 2021-05-14 DIAGNOSIS — J301 Allergic rhinitis due to pollen: Secondary | ICD-10-CM | POA: Diagnosis not present

## 2021-05-14 DIAGNOSIS — J3089 Other allergic rhinitis: Secondary | ICD-10-CM | POA: Diagnosis not present

## 2021-05-15 DIAGNOSIS — M542 Cervicalgia: Secondary | ICD-10-CM | POA: Diagnosis not present

## 2021-05-19 DIAGNOSIS — J3081 Allergic rhinitis due to animal (cat) (dog) hair and dander: Secondary | ICD-10-CM | POA: Diagnosis not present

## 2021-05-19 DIAGNOSIS — J3089 Other allergic rhinitis: Secondary | ICD-10-CM | POA: Diagnosis not present

## 2021-05-19 DIAGNOSIS — J301 Allergic rhinitis due to pollen: Secondary | ICD-10-CM | POA: Diagnosis not present

## 2021-05-28 DIAGNOSIS — J3089 Other allergic rhinitis: Secondary | ICD-10-CM | POA: Diagnosis not present

## 2021-05-28 DIAGNOSIS — J301 Allergic rhinitis due to pollen: Secondary | ICD-10-CM | POA: Diagnosis not present

## 2021-06-01 DIAGNOSIS — M542 Cervicalgia: Secondary | ICD-10-CM | POA: Diagnosis not present

## 2021-06-04 ENCOUNTER — Encounter (HOSPITAL_BASED_OUTPATIENT_CLINIC_OR_DEPARTMENT_OTHER): Payer: Self-pay | Admitting: Emergency Medicine

## 2021-06-04 ENCOUNTER — Emergency Department (HOSPITAL_BASED_OUTPATIENT_CLINIC_OR_DEPARTMENT_OTHER)
Admission: EM | Admit: 2021-06-04 | Discharge: 2021-06-04 | Disposition: A | Discharge status: Home / Self Care | Payer: BC Managed Care – PPO | Attending: Emergency Medicine | Admitting: Emergency Medicine

## 2021-06-04 ENCOUNTER — Emergency Department (HOSPITAL_BASED_OUTPATIENT_CLINIC_OR_DEPARTMENT_OTHER): Payer: BC Managed Care – PPO

## 2021-06-04 ENCOUNTER — Other Ambulatory Visit: Payer: Self-pay

## 2021-06-04 DIAGNOSIS — R0602 Shortness of breath: Secondary | ICD-10-CM | POA: Insufficient documentation

## 2021-06-04 DIAGNOSIS — R079 Chest pain, unspecified: Secondary | ICD-10-CM | POA: Diagnosis not present

## 2021-06-04 DIAGNOSIS — M549 Dorsalgia, unspecified: Secondary | ICD-10-CM | POA: Diagnosis not present

## 2021-06-04 DIAGNOSIS — M546 Pain in thoracic spine: Secondary | ICD-10-CM | POA: Insufficient documentation

## 2021-06-04 DIAGNOSIS — J3081 Allergic rhinitis due to animal (cat) (dog) hair and dander: Secondary | ICD-10-CM | POA: Diagnosis not present

## 2021-06-04 DIAGNOSIS — J3089 Other allergic rhinitis: Secondary | ICD-10-CM | POA: Diagnosis not present

## 2021-06-04 DIAGNOSIS — J301 Allergic rhinitis due to pollen: Secondary | ICD-10-CM | POA: Diagnosis not present

## 2021-06-04 DIAGNOSIS — R9431 Abnormal electrocardiogram [ECG] [EKG]: Secondary | ICD-10-CM | POA: Diagnosis not present

## 2021-06-04 LAB — CBC WITH DIFFERENTIAL/PLATELET
Abs Immature Granulocytes: 0.02 10*3/uL (ref 0.00–0.07)
Basophils Absolute: 0 10*3/uL (ref 0.0–0.1)
Basophils Relative: 1 %
Eosinophils Absolute: 0 10*3/uL (ref 0.0–0.5)
Eosinophils Relative: 0 %
HCT: 41.2 % (ref 36.0–46.0)
Hemoglobin: 13.9 g/dL (ref 12.0–15.0)
Immature Granulocytes: 0 %
Lymphocytes Relative: 34 %
Lymphs Abs: 1.9 10*3/uL (ref 0.7–4.0)
MCH: 31 pg (ref 26.0–34.0)
MCHC: 33.7 g/dL (ref 30.0–36.0)
MCV: 91.8 fL (ref 80.0–100.0)
Monocytes Absolute: 0.4 10*3/uL (ref 0.1–1.0)
Monocytes Relative: 7 %
Neutro Abs: 3.4 10*3/uL (ref 1.7–7.7)
Neutrophils Relative %: 58 %
Platelets: 307 10*3/uL (ref 150–400)
RBC: 4.49 MIL/uL (ref 3.87–5.11)
RDW: 13 % (ref 11.5–15.5)
WBC: 5.8 10*3/uL (ref 4.0–10.5)
nRBC: 0 % (ref 0.0–0.2)

## 2021-06-04 LAB — TROPONIN I (HIGH SENSITIVITY): Troponin I (High Sensitivity): <2 ng/L (ref <18)

## 2021-06-04 LAB — BASIC METABOLIC PANEL
Anion gap: 6 (ref 5–15)
BUN: 13 mg/dL (ref 6–20)
CO2: 27 mmol/L (ref 22–32)
Calcium: 9 mg/dL (ref 8.9–10.3)
Chloride: 106 mmol/L (ref 98–111)
Creatinine, Ser: 0.86 mg/dL (ref 0.44–1.00)
GFR, Estimated: >60 mL/min (ref >60)
Glucose, Bld: 97 mg/dL (ref 70–99)
Potassium: 3.9 mmol/L (ref 3.5–5.1)
Sodium: 139 mmol/L (ref 135–145)

## 2021-06-04 LAB — D-DIMER, QUANTITATIVE: D-Dimer, Quant: <0.27 ug/mL-FEU (ref 0.00–0.50)

## 2021-06-04 MED ORDER — LIDOCAINE 5 % EX PTCH
1.0000 | MEDICATED_PATCH | Freq: Once | CUTANEOUS | Status: DC
  Administered 2021-06-04: 1 via TRANSDERMAL
  Filled 2021-06-04: qty 1

## 2021-06-04 MED ORDER — METHOCARBAMOL 500 MG PO TABS
500.0000 mg | ORAL_TABLET | Freq: Once | ORAL | Status: DC
  Filled 2021-06-04: qty 1

## 2021-06-04 MED ORDER — METHOCARBAMOL 500 MG PO TABS: 500.0000 mg | ORAL_TABLET | Freq: Two times a day (BID) | ORAL | 0 refills | Status: DC

## 2021-06-04 MED ORDER — KETOROLAC TROMETHAMINE 30 MG/ML IJ SOLN
30.0000 mg | Freq: Once | INTRAMUSCULAR | Status: AC
  Administered 2021-06-04: 30 mg via INTRAVENOUS
  Filled 2021-06-04: qty 1

## 2021-06-04 NOTE — ED Notes (Signed)
Patient transported to CT 

## 2021-06-04 NOTE — ED Provider Notes (Signed)
?[facility] ?Provider Note ? ? ?CSN: [id number] ?Arrival date & time: 06/04/21  1143 ? ?  ? ?History ? ?Chief Complaint  ?Patient presents with  ? Back Pain  ? ? ?Kristy Benton is a 41 y.o. female. ? ?Kristy Benton is a 41 y.o. female with hx of seasonal allergies, fibroids and depression who presents for evaluation of thoracic back pain and shortness of breath. Pt reports pain started 4 days ago and is located just medial to her right shoulder blade. She reports it is a sharp pain that is worse with inspiration and with certain movements and worse when laying flat to sleep. Reports she has noticed increased shortness of breath when waking her dog the past few days which is very atypical for her. No anterior chest pain, lightheadedness or syncope. No lower leg pain or swelling, not on any estrogen. No prior hx of PE/DVT, and no recent travel or surgery. ? ?The history is provided by the patient.  ? ?  ? ?Home Medications ?Prior to Admission medications   ?Medication Sig Start Date End Date Taking? Authorizing Provider  ?buPROPion (WELLBUTRIN XL) 150 MG 24 hr tablet Take 150 mg by mouth daily.    [provider]  ?Cholecalciferol (VITAMIN D) 50 MCG (2000 UT) tablet Take 2,000 Units by mouth daily. ?Patient not taking: Reported on 02/26/2021    [provider]  ?ferrous sulfate 325 (65 FE) MG tablet Take 325 mg by mouth every other day. ?Patient not taking: Reported on 02/26/2021    [provider]  ?fexofenadine (ALLEGRA) 180 MG tablet Take 180 mg by mouth daily.    [provider]  ?fluticasone (FLONASE) 50 MCG/ACT nasal spray Place 2 sprays into both nostrils daily.    [provider]  ?montelukast (SINGULAIR) 10 MG tablet Take 10 mg by mouth at bedtime.    [provider]  ?Probiotic Product (PROBIOTIC-10 PO) Take by mouth.    [provider]  ?   ? ?Allergies    ?Amoxicillin   ? ?Review of Systems   ?Review of  Systems  ?Constitutional:  Negative for chills.  ?HENT: Negative.    ?Respiratory:  Positive for shortness of breath. Negative for cough.   ?Cardiovascular:  Negative for chest pain and leg swelling.  ?Gastrointestinal:  Negative for abdominal pain, nausea and vomiting.  ?Musculoskeletal:  Positive for back pain.  ?Neurological:  Negative for syncope, weakness and numbness.  ? ?Physical Exam ?Updated Vital Signs ?BP 115/73 (BP Location: Left Arm)   Pulse 69   Temp 98.3 ?F (36.8 ?C)   Resp 16   Ht '5\' 8"'$  (1.727 m)   Wt 63.5 kg   SpO2 100%   BMI 21.29 kg/m?  ?Physical Exam ?Vitals and nursing note reviewed.  ?Constitutional:   ?   General: She is not in acute distress. ?   Appearance: Normal appearance. She is well-developed. She is not ill-appearing or diaphoretic.  ?HENT:  ?   Head: Normocephalic and atraumatic.  ?Eyes:  ?   General:     ?   Right eye: No discharge.     ?   Left eye: No discharge.  ?Cardiovascular:  ?   Rate and Rhythm: Normal rate and regular rhythm.  ?   Pulses: Normal pulses.  ?   Heart sounds: Normal heart sounds.  ?Pulmonary:  ?   Effort: Pulmonary effort is normal. No respiratory distress.  ?   Breath sounds: Normal breath sounds.  ?  Chest:  ?   Chest wall: No tenderness.  ?Abdominal:  ?   General: Bowel sounds are normal. There is no distension.  ?   Palpations: Abdomen is soft.  ?   Tenderness: There is no abdominal tenderness. There is no guarding.  ?Musculoskeletal:  ?     Back: ? ?   Right lower leg: No edema.  ?   Left lower leg: No edema.  ?Skin: ?   General: Skin is warm and dry.  ?   Findings: No rash.  ?Neurological:  ?   Mental Status: She is alert and oriented to person, place, and time.  ?   Coordination: Coordination normal.  ?   Comments: Speech is clear, able to follow commands ?CN III-XII intact ?Normal strength in upper and lower extremities bilaterally including dorsiflexion and plantar flexion, strong and equal grip strength ?Sensation normal to light and sharp  touch ?Moves extremities without ataxia, coordination intact ? ?  ?Psychiatric:     ?   Mood and Affect: Mood normal.     ?   Behavior: Behavior normal.  ? ? ?ED Results / Procedures / Treatments   ?Labs ?(all labs ordered are listed, but only abnormal results are displayed) ?Labs Reviewed  ?CBC WITH DIFFERENTIAL/PLATELET  ?D-DIMER, QUANTITATIVE  ?BASIC METABOLIC PANEL  ?TROPONIN I (HIGH SENSITIVITY)  ? ? ?EKG ?EKG Interpretation ? ?Date/Time:  Thursday June 04 2021 13:46:49 EDT ?Ventricular Rate:  63 ?PR Interval:  176 ?QRS Duration: 91 ?QT Interval:  418 ?QTC Calculation: 428 ?R Axis:   97 ?Text Interpretation: Sinus rhythm Borderline right axis deviation Confirmed by Octaviano Glow [phone]) on 06/04/2021 1:55:25 PM ? ?Radiology ?DG Chest 2 View ? ?Result Date: 06/04/2021 ?CLINICAL DATA:  CP, thoracic back pain EXAM: CHEST - 2 VIEW COMPARISON:  None. FINDINGS: No consolidation. Prominent nipple shadows. No visible pleural effusions or pneumothorax. Cardiomediastinal silhouette is within normal limits. No evidence of acute osseous abnormality. IMPRESSION: No evidence of acute cardiopulmonary disease. Electronically Signed   By: Margaretha Sheffield M.D.   On: 06/04/2021 14:10   ? ?Procedures ?Procedures  ? ? ?Medications Ordered in ED ?Medications  ?ketorolac (TORADOL) 30 MG/ML injection 30 mg (has no administration in time range)  ?methocarbamol (ROBAXIN) tablet 500 mg (has no administration in time range)  ?lidocaine (LIDODERM) 5 % 1 patch (has no administration in time range)  ? ? ?ED Course/ Medical Decision Making/ A&P ?  ?                        ? ?This patient presents to the ED for concern of thoracic back pain, this involves an extensive number of treatment options, and is a complaint that carries with it a high risk of complications and morbidity.  The differential diagnosis includes musculoskeletal pain, PE, ACS, aortic dissection, shingles ? ? ? ?Additional history obtained: ? ?Additional history obtained  from chart review ?External records from outside source obtained and reviewed including PCP follow up ? ? ?Lab Tests: ? ?I Ordered, and personally interpreted labs.  The pertinent results include:  no leukocytosis, normal Hgb, no electrolyte derangements, normal trop and d-dimer ? ? ?Imaging Studies ordered: ? ?I ordered imaging studies including CXR  ?I independently visualized and interpreted imaging which showed no active cardiopulmonary disease ?I agree with the radiologist interpretation ? ? ?Cardiac Monitoring: / EKG: ? ?The patient was maintained on a cardiac monitor.  I personally viewed and interpreted the cardiac monitored which  showed an underlying rhythm of: NSR ? ? ? ?Problem List / ED Course / Critical interventions / Medication management ? ?Pt with pleuritic thoracic back pain, concerning for potential PE, but pt overall low risk wells, fortunately work up reassuring with normal d-dimer, clear CXR and neg trop with normal EKG ?Will treat for MSK pain ?I ordered medication including toradol and lidoderm patch  for pain relief  ?Reevaluation of the patient after these medicines showed that the patient improved ?I have reviewed the patients home medicines and have made adjustments as needed ? ? ? ? ?Test / Admission - Considered: ? ?Reassuring work up suggestive of musculoskeletal pain, will not require admission, appropriate for DC home with outpatient follow up ? ? ? ? ? ? ? ? ?Final Clinical Impression(s) / ED Diagnoses ?Final diagnoses:  ?Acute right-sided thoracic back pain  ? ? ?Rx / DC Orders ?ED Discharge Orders   ? ?      Ordered  ?  methocarbamol (ROBAXIN) 500 MG tablet  2 times daily       ? 06/04/21 1544  ? ?  ?  ? ?  ? ? ?  ?Jacqlyn Larsen, PA-C ?06/11/21 1557 ? ?  ?Wyvonnia Dusky, MD ?06/11/21 2031 ? ?

## 2021-06-04 NOTE — Discharge Instructions (Addendum)
Your evaluation today has been reassuring, I do not see signs of a blood clot or stress on your heart, no evidence of pneumonia on your chest x-ray.  Suspect this pain is due to musculoskeletal injury or muscle spasm.  To treat pain you can use ibuprofen or Aleve you can also use over-the-counter Salonpas lidocaine patches which can be worn for 12 hours at a time and you can use prescribed muscle relaxer as needed, this medication can cause some drowsiness.  Please follow-up with your primary care provider if symptoms persist.  You can also try rolling over the area with a tennis ball or using a massage gun in the area to help release tension. ?

## 2021-06-04 NOTE — ED Notes (Signed)
Pt verbalized understanding to pick up prescriptions at pharmacy listed on d/c instructions.  ?

## 2021-06-04 NOTE — ED Triage Notes (Signed)
Patient arrives ambulatory by POV c/o mid right sided back pain onset of Monday. Denies any injury. Pain worse with movement and laying flat at night. Reports work of breathing increased while walking her dog which is not usual for her.  ?

## 2021-06-05 DIAGNOSIS — M542 Cervicalgia: Secondary | ICD-10-CM | POA: Diagnosis not present

## 2021-06-10 DIAGNOSIS — J3089 Other allergic rhinitis: Secondary | ICD-10-CM | POA: Diagnosis not present

## 2021-06-10 DIAGNOSIS — J3081 Allergic rhinitis due to animal (cat) (dog) hair and dander: Secondary | ICD-10-CM | POA: Diagnosis not present

## 2021-06-10 DIAGNOSIS — J301 Allergic rhinitis due to pollen: Secondary | ICD-10-CM | POA: Diagnosis not present

## 2021-06-22 DIAGNOSIS — J3081 Allergic rhinitis due to animal (cat) (dog) hair and dander: Secondary | ICD-10-CM | POA: Diagnosis not present

## 2021-06-22 DIAGNOSIS — J3089 Other allergic rhinitis: Secondary | ICD-10-CM | POA: Diagnosis not present

## 2021-06-22 DIAGNOSIS — J301 Allergic rhinitis due to pollen: Secondary | ICD-10-CM | POA: Diagnosis not present

## 2021-07-01 DIAGNOSIS — J3089 Other allergic rhinitis: Secondary | ICD-10-CM | POA: Diagnosis not present

## 2021-07-01 DIAGNOSIS — J301 Allergic rhinitis due to pollen: Secondary | ICD-10-CM | POA: Diagnosis not present

## 2021-07-01 DIAGNOSIS — H1045 Other chronic allergic conjunctivitis: Secondary | ICD-10-CM | POA: Diagnosis not present

## 2021-07-06 DIAGNOSIS — J3089 Other allergic rhinitis: Secondary | ICD-10-CM | POA: Diagnosis not present

## 2021-07-06 DIAGNOSIS — J301 Allergic rhinitis due to pollen: Secondary | ICD-10-CM | POA: Diagnosis not present

## 2021-07-06 DIAGNOSIS — J3081 Allergic rhinitis due to animal (cat) (dog) hair and dander: Secondary | ICD-10-CM | POA: Diagnosis not present

## 2021-07-15 DIAGNOSIS — J3089 Other allergic rhinitis: Secondary | ICD-10-CM | POA: Diagnosis not present

## 2021-07-15 DIAGNOSIS — J301 Allergic rhinitis due to pollen: Secondary | ICD-10-CM | POA: Diagnosis not present

## 2021-07-21 DIAGNOSIS — J301 Allergic rhinitis due to pollen: Secondary | ICD-10-CM | POA: Diagnosis not present

## 2021-07-21 DIAGNOSIS — J3089 Other allergic rhinitis: Secondary | ICD-10-CM | POA: Diagnosis not present

## 2021-07-27 DIAGNOSIS — J301 Allergic rhinitis due to pollen: Secondary | ICD-10-CM | POA: Diagnosis not present

## 2021-07-28 DIAGNOSIS — J301 Allergic rhinitis due to pollen: Secondary | ICD-10-CM | POA: Diagnosis not present

## 2021-07-28 DIAGNOSIS — J3089 Other allergic rhinitis: Secondary | ICD-10-CM | POA: Diagnosis not present

## 2021-08-11 DIAGNOSIS — J3089 Other allergic rhinitis: Secondary | ICD-10-CM | POA: Diagnosis not present

## 2021-08-11 DIAGNOSIS — J301 Allergic rhinitis due to pollen: Secondary | ICD-10-CM | POA: Diagnosis not present

## 2021-08-11 DIAGNOSIS — J3081 Allergic rhinitis due to animal (cat) (dog) hair and dander: Secondary | ICD-10-CM | POA: Diagnosis not present

## 2021-08-18 DIAGNOSIS — J3089 Other allergic rhinitis: Secondary | ICD-10-CM | POA: Diagnosis not present

## 2021-08-18 DIAGNOSIS — J301 Allergic rhinitis due to pollen: Secondary | ICD-10-CM | POA: Diagnosis not present

## 2021-08-18 DIAGNOSIS — J3081 Allergic rhinitis due to animal (cat) (dog) hair and dander: Secondary | ICD-10-CM | POA: Diagnosis not present

## 2021-08-24 DIAGNOSIS — M9901 Segmental and somatic dysfunction of cervical region: Secondary | ICD-10-CM | POA: Diagnosis not present

## 2021-08-24 DIAGNOSIS — M9904 Segmental and somatic dysfunction of sacral region: Secondary | ICD-10-CM | POA: Diagnosis not present

## 2021-08-24 DIAGNOSIS — J301 Allergic rhinitis due to pollen: Secondary | ICD-10-CM | POA: Diagnosis not present

## 2021-08-24 DIAGNOSIS — M9902 Segmental and somatic dysfunction of thoracic region: Secondary | ICD-10-CM | POA: Diagnosis not present

## 2021-08-24 DIAGNOSIS — M9903 Segmental and somatic dysfunction of lumbar region: Secondary | ICD-10-CM | POA: Diagnosis not present

## 2021-08-24 DIAGNOSIS — F411 Generalized anxiety disorder: Secondary | ICD-10-CM | POA: Diagnosis not present

## 2021-08-24 DIAGNOSIS — J3089 Other allergic rhinitis: Secondary | ICD-10-CM | POA: Diagnosis not present

## 2021-08-28 ENCOUNTER — Ambulatory Visit: Payer: BC Managed Care – PPO | Admitting: Family Medicine

## 2021-08-28 DIAGNOSIS — J3089 Other allergic rhinitis: Secondary | ICD-10-CM | POA: Diagnosis not present

## 2021-08-28 DIAGNOSIS — J3081 Allergic rhinitis due to animal (cat) (dog) hair and dander: Secondary | ICD-10-CM | POA: Diagnosis not present

## 2021-08-28 DIAGNOSIS — J301 Allergic rhinitis due to pollen: Secondary | ICD-10-CM | POA: Diagnosis not present

## 2021-08-31 DIAGNOSIS — M9901 Segmental and somatic dysfunction of cervical region: Secondary | ICD-10-CM | POA: Diagnosis not present

## 2021-08-31 DIAGNOSIS — M9903 Segmental and somatic dysfunction of lumbar region: Secondary | ICD-10-CM | POA: Diagnosis not present

## 2021-08-31 DIAGNOSIS — M9904 Segmental and somatic dysfunction of sacral region: Secondary | ICD-10-CM | POA: Diagnosis not present

## 2021-08-31 DIAGNOSIS — M9902 Segmental and somatic dysfunction of thoracic region: Secondary | ICD-10-CM | POA: Diagnosis not present

## 2021-09-04 DIAGNOSIS — J301 Allergic rhinitis due to pollen: Secondary | ICD-10-CM | POA: Diagnosis not present

## 2021-09-04 DIAGNOSIS — J3081 Allergic rhinitis due to animal (cat) (dog) hair and dander: Secondary | ICD-10-CM | POA: Diagnosis not present

## 2021-09-04 DIAGNOSIS — M9901 Segmental and somatic dysfunction of cervical region: Secondary | ICD-10-CM | POA: Diagnosis not present

## 2021-09-04 DIAGNOSIS — J3089 Other allergic rhinitis: Secondary | ICD-10-CM | POA: Diagnosis not present

## 2021-09-04 DIAGNOSIS — M9903 Segmental and somatic dysfunction of lumbar region: Secondary | ICD-10-CM | POA: Diagnosis not present

## 2021-09-04 DIAGNOSIS — M9904 Segmental and somatic dysfunction of sacral region: Secondary | ICD-10-CM | POA: Diagnosis not present

## 2021-09-04 DIAGNOSIS — M9902 Segmental and somatic dysfunction of thoracic region: Secondary | ICD-10-CM | POA: Diagnosis not present

## 2021-09-07 DIAGNOSIS — M9901 Segmental and somatic dysfunction of cervical region: Secondary | ICD-10-CM | POA: Diagnosis not present

## 2021-09-07 DIAGNOSIS — M9903 Segmental and somatic dysfunction of lumbar region: Secondary | ICD-10-CM | POA: Diagnosis not present

## 2021-09-07 DIAGNOSIS — M9904 Segmental and somatic dysfunction of sacral region: Secondary | ICD-10-CM | POA: Diagnosis not present

## 2021-09-07 DIAGNOSIS — M9902 Segmental and somatic dysfunction of thoracic region: Secondary | ICD-10-CM | POA: Diagnosis not present

## 2021-09-11 DIAGNOSIS — M9903 Segmental and somatic dysfunction of lumbar region: Secondary | ICD-10-CM | POA: Diagnosis not present

## 2021-09-11 DIAGNOSIS — M9901 Segmental and somatic dysfunction of cervical region: Secondary | ICD-10-CM | POA: Diagnosis not present

## 2021-09-11 DIAGNOSIS — L812 Freckles: Secondary | ICD-10-CM | POA: Diagnosis not present

## 2021-09-11 DIAGNOSIS — L720 Epidermal cyst: Secondary | ICD-10-CM | POA: Diagnosis not present

## 2021-09-11 DIAGNOSIS — M9904 Segmental and somatic dysfunction of sacral region: Secondary | ICD-10-CM | POA: Diagnosis not present

## 2021-09-11 DIAGNOSIS — J3089 Other allergic rhinitis: Secondary | ICD-10-CM | POA: Diagnosis not present

## 2021-09-11 DIAGNOSIS — J301 Allergic rhinitis due to pollen: Secondary | ICD-10-CM | POA: Diagnosis not present

## 2021-09-11 DIAGNOSIS — M9902 Segmental and somatic dysfunction of thoracic region: Secondary | ICD-10-CM | POA: Diagnosis not present

## 2021-09-11 DIAGNOSIS — D2271 Melanocytic nevi of right lower limb, including hip: Secondary | ICD-10-CM | POA: Diagnosis not present

## 2021-09-11 DIAGNOSIS — J3081 Allergic rhinitis due to animal (cat) (dog) hair and dander: Secondary | ICD-10-CM | POA: Diagnosis not present

## 2021-09-21 DIAGNOSIS — J3081 Allergic rhinitis due to animal (cat) (dog) hair and dander: Secondary | ICD-10-CM | POA: Diagnosis not present

## 2021-09-21 DIAGNOSIS — J3089 Other allergic rhinitis: Secondary | ICD-10-CM | POA: Diagnosis not present

## 2021-09-21 DIAGNOSIS — M9902 Segmental and somatic dysfunction of thoracic region: Secondary | ICD-10-CM | POA: Diagnosis not present

## 2021-09-21 DIAGNOSIS — M9903 Segmental and somatic dysfunction of lumbar region: Secondary | ICD-10-CM | POA: Diagnosis not present

## 2021-09-21 DIAGNOSIS — M9901 Segmental and somatic dysfunction of cervical region: Secondary | ICD-10-CM | POA: Diagnosis not present

## 2021-09-21 DIAGNOSIS — J301 Allergic rhinitis due to pollen: Secondary | ICD-10-CM | POA: Diagnosis not present

## 2021-09-21 DIAGNOSIS — M9904 Segmental and somatic dysfunction of sacral region: Secondary | ICD-10-CM | POA: Diagnosis not present

## 2021-09-24 ENCOUNTER — Other Ambulatory Visit: Payer: Self-pay | Admitting: Internal Medicine

## 2021-09-24 MED ORDER — BUPROPION HCL ER (XL) 150 MG PO TB24: 150.0000 mg | ORAL_TABLET | Freq: Every day | ORAL | 1 refills | Status: DC

## 2021-09-24 NOTE — Telephone Encounter (Signed)
Refill sent.

## 2021-09-24 NOTE — Telephone Encounter (Signed)
Pt is calling and would like a refill on buPROPion (WELLBUTRIN XL) 150 MG 24 hr tablet this med was originally prescribed by her old md. Pt states dr Jerilee Hoh said she would refill this med. [facility] [id number] - [address], [place] Phone:  [phone]  Fax:  [phone]

## 2021-09-25 DIAGNOSIS — M9901 Segmental and somatic dysfunction of cervical region: Secondary | ICD-10-CM | POA: Diagnosis not present

## 2021-09-25 DIAGNOSIS — J3089 Other allergic rhinitis: Secondary | ICD-10-CM | POA: Diagnosis not present

## 2021-09-25 DIAGNOSIS — M9902 Segmental and somatic dysfunction of thoracic region: Secondary | ICD-10-CM | POA: Diagnosis not present

## 2021-09-25 DIAGNOSIS — J3081 Allergic rhinitis due to animal (cat) (dog) hair and dander: Secondary | ICD-10-CM | POA: Diagnosis not present

## 2021-09-25 DIAGNOSIS — M9903 Segmental and somatic dysfunction of lumbar region: Secondary | ICD-10-CM | POA: Diagnosis not present

## 2021-09-25 DIAGNOSIS — J301 Allergic rhinitis due to pollen: Secondary | ICD-10-CM | POA: Diagnosis not present

## 2021-09-25 DIAGNOSIS — M9904 Segmental and somatic dysfunction of sacral region: Secondary | ICD-10-CM | POA: Diagnosis not present

## 2021-09-28 DIAGNOSIS — M9903 Segmental and somatic dysfunction of lumbar region: Secondary | ICD-10-CM | POA: Diagnosis not present

## 2021-09-28 DIAGNOSIS — M9901 Segmental and somatic dysfunction of cervical region: Secondary | ICD-10-CM | POA: Diagnosis not present

## 2021-09-28 DIAGNOSIS — M9902 Segmental and somatic dysfunction of thoracic region: Secondary | ICD-10-CM | POA: Diagnosis not present

## 2021-09-28 DIAGNOSIS — M9904 Segmental and somatic dysfunction of sacral region: Secondary | ICD-10-CM | POA: Diagnosis not present

## 2021-10-02 DIAGNOSIS — M9901 Segmental and somatic dysfunction of cervical region: Secondary | ICD-10-CM | POA: Diagnosis not present

## 2021-10-02 DIAGNOSIS — M9904 Segmental and somatic dysfunction of sacral region: Secondary | ICD-10-CM | POA: Diagnosis not present

## 2021-10-02 DIAGNOSIS — M9902 Segmental and somatic dysfunction of thoracic region: Secondary | ICD-10-CM | POA: Diagnosis not present

## 2021-10-02 DIAGNOSIS — M9903 Segmental and somatic dysfunction of lumbar region: Secondary | ICD-10-CM | POA: Diagnosis not present

## 2021-10-06 DIAGNOSIS — R1030 Lower abdominal pain, unspecified: Secondary | ICD-10-CM | POA: Diagnosis not present

## 2021-10-06 DIAGNOSIS — M9904 Segmental and somatic dysfunction of sacral region: Secondary | ICD-10-CM | POA: Diagnosis not present

## 2021-10-06 DIAGNOSIS — M9901 Segmental and somatic dysfunction of cervical region: Secondary | ICD-10-CM | POA: Diagnosis not present

## 2021-10-06 DIAGNOSIS — M9903 Segmental and somatic dysfunction of lumbar region: Secondary | ICD-10-CM | POA: Diagnosis not present

## 2021-10-06 DIAGNOSIS — M9902 Segmental and somatic dysfunction of thoracic region: Secondary | ICD-10-CM | POA: Diagnosis not present

## 2021-10-09 DIAGNOSIS — J3089 Other allergic rhinitis: Secondary | ICD-10-CM | POA: Diagnosis not present

## 2021-10-09 DIAGNOSIS — M9902 Segmental and somatic dysfunction of thoracic region: Secondary | ICD-10-CM | POA: Diagnosis not present

## 2021-10-09 DIAGNOSIS — M9904 Segmental and somatic dysfunction of sacral region: Secondary | ICD-10-CM | POA: Diagnosis not present

## 2021-10-09 DIAGNOSIS — J301 Allergic rhinitis due to pollen: Secondary | ICD-10-CM | POA: Diagnosis not present

## 2021-10-09 DIAGNOSIS — M9903 Segmental and somatic dysfunction of lumbar region: Secondary | ICD-10-CM | POA: Diagnosis not present

## 2021-10-09 DIAGNOSIS — M9901 Segmental and somatic dysfunction of cervical region: Secondary | ICD-10-CM | POA: Diagnosis not present

## 2021-10-12 DIAGNOSIS — M9904 Segmental and somatic dysfunction of sacral region: Secondary | ICD-10-CM | POA: Diagnosis not present

## 2021-10-12 DIAGNOSIS — M9902 Segmental and somatic dysfunction of thoracic region: Secondary | ICD-10-CM | POA: Diagnosis not present

## 2021-10-12 DIAGNOSIS — M9901 Segmental and somatic dysfunction of cervical region: Secondary | ICD-10-CM | POA: Diagnosis not present

## 2021-10-12 DIAGNOSIS — M9903 Segmental and somatic dysfunction of lumbar region: Secondary | ICD-10-CM | POA: Diagnosis not present

## 2021-10-12 DIAGNOSIS — J3081 Allergic rhinitis due to animal (cat) (dog) hair and dander: Secondary | ICD-10-CM | POA: Insufficient documentation

## 2021-10-12 DIAGNOSIS — H52203 Unspecified astigmatism, bilateral: Secondary | ICD-10-CM | POA: Diagnosis not present

## 2021-10-12 DIAGNOSIS — H1045 Other chronic allergic conjunctivitis: Secondary | ICD-10-CM | POA: Insufficient documentation

## 2021-10-12 DIAGNOSIS — J301 Allergic rhinitis due to pollen: Secondary | ICD-10-CM | POA: Insufficient documentation

## 2021-10-12 DIAGNOSIS — H532 Diplopia: Secondary | ICD-10-CM | POA: Diagnosis not present

## 2021-10-12 DIAGNOSIS — J309 Allergic rhinitis, unspecified: Secondary | ICD-10-CM | POA: Insufficient documentation

## 2021-10-16 DIAGNOSIS — M9902 Segmental and somatic dysfunction of thoracic region: Secondary | ICD-10-CM | POA: Diagnosis not present

## 2021-10-16 DIAGNOSIS — M9904 Segmental and somatic dysfunction of sacral region: Secondary | ICD-10-CM | POA: Diagnosis not present

## 2021-10-16 DIAGNOSIS — M9901 Segmental and somatic dysfunction of cervical region: Secondary | ICD-10-CM | POA: Diagnosis not present

## 2021-10-16 DIAGNOSIS — M9903 Segmental and somatic dysfunction of lumbar region: Secondary | ICD-10-CM | POA: Diagnosis not present

## 2021-10-19 DIAGNOSIS — M9901 Segmental and somatic dysfunction of cervical region: Secondary | ICD-10-CM | POA: Diagnosis not present

## 2021-10-19 DIAGNOSIS — M9903 Segmental and somatic dysfunction of lumbar region: Secondary | ICD-10-CM | POA: Diagnosis not present

## 2021-10-19 DIAGNOSIS — M9902 Segmental and somatic dysfunction of thoracic region: Secondary | ICD-10-CM | POA: Diagnosis not present

## 2021-10-19 DIAGNOSIS — M9904 Segmental and somatic dysfunction of sacral region: Secondary | ICD-10-CM | POA: Diagnosis not present

## 2021-10-21 DIAGNOSIS — J3089 Other allergic rhinitis: Secondary | ICD-10-CM | POA: Diagnosis not present

## 2021-10-21 DIAGNOSIS — J301 Allergic rhinitis due to pollen: Secondary | ICD-10-CM | POA: Diagnosis not present

## 2021-10-21 DIAGNOSIS — J3081 Allergic rhinitis due to animal (cat) (dog) hair and dander: Secondary | ICD-10-CM | POA: Diagnosis not present

## 2021-10-22 ENCOUNTER — Encounter: Payer: BC Managed Care – PPO | Admitting: Internal Medicine

## 2021-10-22 DIAGNOSIS — M9904 Segmental and somatic dysfunction of sacral region: Secondary | ICD-10-CM | POA: Diagnosis not present

## 2021-10-22 DIAGNOSIS — M9902 Segmental and somatic dysfunction of thoracic region: Secondary | ICD-10-CM | POA: Diagnosis not present

## 2021-10-22 DIAGNOSIS — L920 Granuloma annulare: Secondary | ICD-10-CM | POA: Diagnosis not present

## 2021-10-22 DIAGNOSIS — M9903 Segmental and somatic dysfunction of lumbar region: Secondary | ICD-10-CM | POA: Diagnosis not present

## 2021-10-22 DIAGNOSIS — L218 Other seborrheic dermatitis: Secondary | ICD-10-CM | POA: Diagnosis not present

## 2021-10-22 DIAGNOSIS — L7211 Pilar cyst: Secondary | ICD-10-CM | POA: Diagnosis not present

## 2021-10-22 DIAGNOSIS — L239 Allergic contact dermatitis, unspecified cause: Secondary | ICD-10-CM | POA: Diagnosis not present

## 2021-10-22 DIAGNOSIS — M9901 Segmental and somatic dysfunction of cervical region: Secondary | ICD-10-CM | POA: Diagnosis not present

## 2021-10-26 DIAGNOSIS — M9901 Segmental and somatic dysfunction of cervical region: Secondary | ICD-10-CM | POA: Diagnosis not present

## 2021-10-26 DIAGNOSIS — M9904 Segmental and somatic dysfunction of sacral region: Secondary | ICD-10-CM | POA: Diagnosis not present

## 2021-10-26 DIAGNOSIS — M9903 Segmental and somatic dysfunction of lumbar region: Secondary | ICD-10-CM | POA: Diagnosis not present

## 2021-10-26 DIAGNOSIS — M9902 Segmental and somatic dysfunction of thoracic region: Secondary | ICD-10-CM | POA: Diagnosis not present

## 2021-10-28 DIAGNOSIS — M9903 Segmental and somatic dysfunction of lumbar region: Secondary | ICD-10-CM | POA: Diagnosis not present

## 2021-10-28 DIAGNOSIS — M9904 Segmental and somatic dysfunction of sacral region: Secondary | ICD-10-CM | POA: Diagnosis not present

## 2021-10-28 DIAGNOSIS — M9902 Segmental and somatic dysfunction of thoracic region: Secondary | ICD-10-CM | POA: Diagnosis not present

## 2021-10-28 DIAGNOSIS — M9901 Segmental and somatic dysfunction of cervical region: Secondary | ICD-10-CM | POA: Diagnosis not present

## 2021-11-03 DIAGNOSIS — M9903 Segmental and somatic dysfunction of lumbar region: Secondary | ICD-10-CM | POA: Diagnosis not present

## 2021-11-03 DIAGNOSIS — M9901 Segmental and somatic dysfunction of cervical region: Secondary | ICD-10-CM | POA: Diagnosis not present

## 2021-11-03 DIAGNOSIS — M9902 Segmental and somatic dysfunction of thoracic region: Secondary | ICD-10-CM | POA: Diagnosis not present

## 2021-11-03 DIAGNOSIS — M9904 Segmental and somatic dysfunction of sacral region: Secondary | ICD-10-CM | POA: Diagnosis not present

## 2021-11-05 DIAGNOSIS — J3089 Other allergic rhinitis: Secondary | ICD-10-CM | POA: Diagnosis not present

## 2021-11-05 DIAGNOSIS — J3081 Allergic rhinitis due to animal (cat) (dog) hair and dander: Secondary | ICD-10-CM | POA: Diagnosis not present

## 2021-11-05 DIAGNOSIS — J301 Allergic rhinitis due to pollen: Secondary | ICD-10-CM | POA: Diagnosis not present

## 2021-11-11 DIAGNOSIS — D2262 Melanocytic nevi of left upper limb, including shoulder: Secondary | ICD-10-CM | POA: Diagnosis not present

## 2021-11-11 DIAGNOSIS — L239 Allergic contact dermatitis, unspecified cause: Secondary | ICD-10-CM | POA: Diagnosis not present

## 2021-11-11 DIAGNOSIS — D1801 Hemangioma of skin and subcutaneous tissue: Secondary | ICD-10-CM | POA: Diagnosis not present

## 2021-11-11 DIAGNOSIS — D2271 Melanocytic nevi of right lower limb, including hip: Secondary | ICD-10-CM | POA: Diagnosis not present

## 2021-11-11 DIAGNOSIS — M9902 Segmental and somatic dysfunction of thoracic region: Secondary | ICD-10-CM | POA: Diagnosis not present

## 2021-11-11 DIAGNOSIS — M9904 Segmental and somatic dysfunction of sacral region: Secondary | ICD-10-CM | POA: Diagnosis not present

## 2021-11-11 DIAGNOSIS — M9903 Segmental and somatic dysfunction of lumbar region: Secondary | ICD-10-CM | POA: Diagnosis not present

## 2021-11-11 DIAGNOSIS — D2261 Melanocytic nevi of right upper limb, including shoulder: Secondary | ICD-10-CM | POA: Diagnosis not present

## 2021-11-11 DIAGNOSIS — M9901 Segmental and somatic dysfunction of cervical region: Secondary | ICD-10-CM | POA: Diagnosis not present

## 2021-11-18 DIAGNOSIS — J301 Allergic rhinitis due to pollen: Secondary | ICD-10-CM | POA: Diagnosis not present

## 2021-11-18 DIAGNOSIS — J3089 Other allergic rhinitis: Secondary | ICD-10-CM | POA: Diagnosis not present

## 2021-11-26 ENCOUNTER — Other Ambulatory Visit (INDEPENDENT_AMBULATORY_CARE_PROVIDER_SITE_OTHER): Payer: BC Managed Care – PPO

## 2021-11-26 ENCOUNTER — Ambulatory Visit (INDEPENDENT_AMBULATORY_CARE_PROVIDER_SITE_OTHER): Payer: BC Managed Care – PPO | Admitting: Internal Medicine

## 2021-11-26 ENCOUNTER — Encounter: Payer: Self-pay | Admitting: Internal Medicine

## 2021-11-26 VITALS — BP 118/78 | HR 66 | Resp 18 | Ht 68.0 in | Wt 143.2 lb

## 2021-11-26 DIAGNOSIS — F339 Major depressive disorder, recurrent, unspecified: Secondary | ICD-10-CM

## 2021-11-26 DIAGNOSIS — Z0000 Encounter for general adult medical examination without abnormal findings: Secondary | ICD-10-CM

## 2021-11-26 LAB — CBC WITH DIFFERENTIAL/PLATELET
Basophils Absolute: 0 10*3/uL (ref 0.0–0.1)
Basophils Relative: 0.7 % (ref 0.0–3.0)
Eosinophils Absolute: 0 10*3/uL (ref 0.0–0.7)
Eosinophils Relative: 0.3 % (ref 0.0–5.0)
HCT: 38.7 % (ref 36.0–46.0)
Hemoglobin: 13.3 g/dL (ref 12.0–15.0)
Lymphocytes Relative: 31.4 % (ref 12.0–46.0)
Lymphs Abs: 2.1 10*3/uL (ref 0.7–4.0)
MCHC: 34.3 g/dL (ref 30.0–36.0)
MCV: 92.5 fl (ref 78.0–100.0)
Monocytes Absolute: 0.5 10*3/uL (ref 0.1–1.0)
Monocytes Relative: 7.8 % (ref 3.0–12.0)
Neutro Abs: 4 10*3/uL (ref 1.4–7.7)
Neutrophils Relative %: 59.8 % (ref 43.0–77.0)
Platelets: 268 10*3/uL (ref 150.0–400.0)
RBC: 4.18 Mil/uL (ref 3.87–5.11)
RDW: 13.2 % (ref 11.5–15.5)
WBC: 6.6 10*3/uL (ref 4.0–10.5)

## 2021-11-26 LAB — COMPREHENSIVE METABOLIC PANEL
ALT: 13 U/L (ref 0–35)
AST: 16 U/L (ref 0–37)
Albumin: 4.1 g/dL (ref 3.5–5.2)
Alkaline Phosphatase: 58 U/L (ref 39–117)
BUN: 14 mg/dL (ref 6–23)
CO2: 29 mEq/L (ref 19–32)
Calcium: 9.2 mg/dL (ref 8.4–10.5)
Chloride: 105 mEq/L (ref 96–112)
Creatinine, Ser: 0.85 mg/dL (ref 0.40–1.20)
GFR: 84.67 mL/min (ref >60.00)
Glucose, Bld: 83 mg/dL (ref 70–99)
Potassium: 4.2 mEq/L (ref 3.5–5.1)
Sodium: 139 mEq/L (ref 135–145)
Total Bilirubin: 0.6 mg/dL (ref 0.2–1.2)
Total Protein: 6.7 g/dL (ref 6.0–8.3)

## 2021-11-26 LAB — LIPID PANEL
Cholesterol: 155 mg/dL (ref 0–200)
HDL: 68.8 mg/dL (ref >39.00)
LDL Cholesterol: 78 mg/dL (ref 0–99)
NonHDL: 86.52
Total CHOL/HDL Ratio: 2
Triglycerides: 45 mg/dL (ref 0.0–149.0)
VLDL: 9 mg/dL (ref 0.0–40.0)

## 2021-11-26 LAB — VITAMIN B12: Vitamin B-12: 206 pg/mL — ABNORMAL LOW (ref 211–911)

## 2021-11-26 LAB — VITAMIN D 25 HYDROXY (VIT D DEFICIENCY, FRACTURES): VITD: 33.1 ng/mL (ref 30.00–100.00)

## 2021-11-26 LAB — TSH: TSH: 1.09 u[IU]/mL (ref 0.35–5.50)

## 2021-11-26 NOTE — Progress Notes (Signed)
Established Patient Office Visit     CC/Reason for Visit: Annual preventive exam  HPI: Kristy Benton is a 41 y.o. female who is coming in today for the above mentioned reasons. Past Medical History is significant for: Depression with stable mood on Wellbutrin.  She has no acute concerns or complaints.  She has routine eye and dental care.  She had a mammogram earlier this year.  She has follow-up with GYN scheduled for next year.  She is overdue for flu and COVID vaccination.   Past Medical/Surgical History: Past Medical History:  Diagnosis Date   Depression    Uterine fibroid     Past Surgical History:  Procedure Laterality Date   MYOMECTOMY  11/03/2018    Social History:  reports that she has never smoked. She has never used smokeless tobacco. She reports current alcohol use. She reports that she does not use drugs.  Allergies: Allergies  Allergen Reactions   Amoxicillin Hives    Family History:  Family History  Problem Relation Age of Onset   Coronary artery disease Paternal Grandfather      Current Outpatient Medications:    buPROPion (WELLBUTRIN XL) 150 MG 24 hr tablet, Take 1 tablet (150 mg total) by mouth daily., Disp: 90 tablet, Rfl: 1   Cholecalciferol (VITAMIN D) 50 MCG (2000 UT) tablet, Take 2,000 Units by mouth daily., Disp: , Rfl:    ferrous sulfate 325 (65 FE) MG tablet, Take 325 mg by mouth every other day., Disp: , Rfl:    fexofenadine (ALLEGRA) 180 MG tablet, Take 180 mg by mouth daily., Disp: , Rfl:    fluticasone (FLONASE) 50 MCG/ACT nasal spray, Place 2 sprays into both nostrils daily., Disp: , Rfl:    methocarbamol (ROBAXIN) 500 MG tablet, Take 1 tablet (500 mg total) by mouth 2 (two) times daily., Disp: 20 tablet, Rfl: 0   montelukast (SINGULAIR) 10 MG tablet, Take 10 mg by mouth at bedtime., Disp: , Rfl:    Probiotic Product (PROBIOTIC-10 PO), Take by mouth., Disp: , Rfl:   Review of Systems:  Constitutional: Denies fever, chills,  diaphoresis, appetite change and fatigue.  HEENT: Denies photophobia, eye pain, redness, hearing loss, ear pain, congestion, sore throat, rhinorrhea, sneezing, mouth sores, trouble swallowing, neck pain, neck stiffness and tinnitus.   Respiratory: Denies SOB, DOE, cough, chest tightness,  and wheezing.   Cardiovascular: Denies chest pain, palpitations and leg swelling.  Gastrointestinal: Denies nausea, vomiting, abdominal pain, diarrhea, constipation, blood in stool and abdominal distention.  Genitourinary: Denies dysuria, urgency, frequency, hematuria, flank pain and difficulty urinating.  Endocrine: Denies: hot or cold intolerance, sweats, changes in hair or nails, polyuria, polydipsia. Musculoskeletal: Denies myalgias, back pain, joint swelling, arthralgias and gait problem.  Skin: Denies pallor, rash and wound.  Neurological: Denies dizziness, seizures, syncope, weakness, light-headedness, numbness and headaches.  Hematological: Denies adenopathy. Easy bruising, personal or family bleeding history  Psychiatric/Behavioral: Denies suicidal ideation, mood changes, confusion, nervousness, sleep disturbance and agitation    Physical Exam: Vitals:   11/26/21 0842  BP: 118/78  Pulse: 66  Resp: 18  SpO2: 100%  Weight: 143 lb 3 oz (64.9 kg)  Height: '5\' 8"'$  (1.727 m)    Body mass index is 21.77 kg/m.   Constitutional: NAD, calm, comfortable Eyes: PERRL, lids and conjunctivae normal, wears corrective lenses ENMT: Mucous membranes are moist.  Respiratory: clear to auscultation bilaterally, no wheezing, no crackles. Normal respiratory effort. No accessory muscle use.  Cardiovascular: Regular rate and rhythm,  no murmurs / rubs / gallops. No extremity edema. Psychiatric: Normal judgment and insight. Alert and oriented x 3. Normal mood.    Impression and Plan:  Encounter for preventive health examination  Depression, recurrent ([place]) - Plan: CBC with Differential/Platelet, Comprehensive  metabolic panel, Hemoglobin A1c, Lipid panel, TSH, Vitamin B12, VITAMIN D 25 Hydroxy (Vit-D Deficiency, Fractures), VITAMIN D 25 Hydroxy (Vit-D Deficiency, Fractures), Vitamin B12, TSH, Lipid panel, Hemoglobin A1c, Comprehensive metabolic panel, CBC with Differential/Platelet    -Recommend routine eye and dental care. -Immunizations: Declined flu and COVID despite counseling -Healthy lifestyle discussed in detail. -Labs to be updated today. -Colon cancer screening: Commence age 42 -Breast cancer screening: 05/2021 -Cervical cancer screening: Will follow-up with GYN -Lung cancer screening: Not applicable -Prostate cancer screening: Not applicable -DEXA: Not applicable  Flowsheet Row Office Visit from 11/26/2021 in [place] [facility] [place]  PHQ-9 Total Score 1      -Mood is stable on Wellbutrin.    Lelon Frohlich, MD [facility]

## 2021-11-30 ENCOUNTER — Encounter: Payer: Self-pay | Admitting: Internal Medicine

## 2021-11-30 DIAGNOSIS — E538 Deficiency of other specified B group vitamins: Secondary | ICD-10-CM | POA: Insufficient documentation

## 2021-11-30 LAB — HEMOGLOBIN A1C: Hgb A1c MFr Bld: 5.2 % (ref 4.6–6.5)

## 2021-12-01 DIAGNOSIS — D2271 Melanocytic nevi of right lower limb, including hip: Secondary | ICD-10-CM | POA: Diagnosis not present

## 2021-12-01 DIAGNOSIS — D2262 Melanocytic nevi of left upper limb, including shoulder: Secondary | ICD-10-CM | POA: Diagnosis not present

## 2021-12-01 DIAGNOSIS — D485 Neoplasm of uncertain behavior of skin: Secondary | ICD-10-CM | POA: Diagnosis not present

## 2021-12-02 ENCOUNTER — Ambulatory Visit (INDEPENDENT_AMBULATORY_CARE_PROVIDER_SITE_OTHER): Payer: BC Managed Care – PPO | Admitting: *Deleted

## 2021-12-02 DIAGNOSIS — E538 Deficiency of other specified B group vitamins: Secondary | ICD-10-CM | POA: Diagnosis not present

## 2021-12-02 DIAGNOSIS — J3089 Other allergic rhinitis: Secondary | ICD-10-CM | POA: Diagnosis not present

## 2021-12-02 DIAGNOSIS — J301 Allergic rhinitis due to pollen: Secondary | ICD-10-CM | POA: Diagnosis not present

## 2021-12-02 MED ORDER — CYANOCOBALAMIN 1000 MCG/ML IJ SOLN
1000.0000 ug | Freq: Once | INTRAMUSCULAR | Status: AC
  Administered 2021-12-02: 1000 ug via INTRAMUSCULAR

## 2021-12-02 NOTE — Progress Notes (Signed)
Per orders of Dr. Hernandez, injection of Cyanocobalamin 1000mcg given by Leshawn Straka A. Patient tolerated injection well. 

## 2021-12-09 ENCOUNTER — Ambulatory Visit (INDEPENDENT_AMBULATORY_CARE_PROVIDER_SITE_OTHER): Payer: BC Managed Care – PPO

## 2021-12-09 DIAGNOSIS — E538 Deficiency of other specified B group vitamins: Secondary | ICD-10-CM

## 2021-12-09 MED ORDER — CYANOCOBALAMIN 1000 MCG/ML IJ SOLN
1000.0000 ug | INTRAMUSCULAR | Status: AC
  Administered 2021-12-09: 1000 ug via INTRAMUSCULAR

## 2021-12-09 NOTE — Progress Notes (Signed)
Pt here for weekly B12 injection #2 of 4, then monthly per Dr Jerilee Hoh.  B12 1030mg given IM left deltoid and pt tolerated injection well.   Next B12 injection scheduled for 12/18/21

## 2021-12-18 ENCOUNTER — Ambulatory Visit (INDEPENDENT_AMBULATORY_CARE_PROVIDER_SITE_OTHER): Payer: BC Managed Care – PPO

## 2021-12-18 DIAGNOSIS — J3089 Other allergic rhinitis: Secondary | ICD-10-CM | POA: Diagnosis not present

## 2021-12-18 DIAGNOSIS — J301 Allergic rhinitis due to pollen: Secondary | ICD-10-CM | POA: Diagnosis not present

## 2021-12-18 DIAGNOSIS — E538 Deficiency of other specified B group vitamins: Secondary | ICD-10-CM | POA: Diagnosis not present

## 2021-12-18 DIAGNOSIS — J3081 Allergic rhinitis due to animal (cat) (dog) hair and dander: Secondary | ICD-10-CM | POA: Diagnosis not present

## 2021-12-18 MED ORDER — CYANOCOBALAMIN 1000 MCG/ML IJ SOLN
1000.0000 ug | Freq: Once | INTRAMUSCULAR | Status: AC
  Administered 2021-12-18: 1000 ug via INTRAMUSCULAR

## 2021-12-18 NOTE — Progress Notes (Signed)
Per orders of Dr. Elease Hashimoto, injection of Cyanocobalamin inj 1000 mcg given by Encarnacion Slates on right deltoid.   Patient tolerated injection well.

## 2021-12-24 DIAGNOSIS — L988 Other specified disorders of the skin and subcutaneous tissue: Secondary | ICD-10-CM | POA: Diagnosis not present

## 2021-12-24 DIAGNOSIS — D485 Neoplasm of uncertain behavior of skin: Secondary | ICD-10-CM | POA: Diagnosis not present

## 2021-12-25 ENCOUNTER — Ambulatory Visit (INDEPENDENT_AMBULATORY_CARE_PROVIDER_SITE_OTHER): Payer: BC Managed Care – PPO

## 2021-12-25 DIAGNOSIS — E538 Deficiency of other specified B group vitamins: Secondary | ICD-10-CM | POA: Diagnosis not present

## 2021-12-25 MED ORDER — CYANOCOBALAMIN 1000 MCG/ML IJ SOLN
1000.0000 ug | Freq: Once | INTRAMUSCULAR | Status: AC
  Administered 2021-12-25: 1000 ug via INTRAMUSCULAR

## 2021-12-25 NOTE — Progress Notes (Signed)
Per orders of Dr. Elease Hashimoto , injection of Cyanocobalamin Inj. 1000 mcg given by Encarnacion Slates. Patient tolerated injection well.

## 2021-12-31 DIAGNOSIS — J3081 Allergic rhinitis due to animal (cat) (dog) hair and dander: Secondary | ICD-10-CM | POA: Diagnosis not present

## 2021-12-31 DIAGNOSIS — J3089 Other allergic rhinitis: Secondary | ICD-10-CM | POA: Diagnosis not present

## 2021-12-31 DIAGNOSIS — J301 Allergic rhinitis due to pollen: Secondary | ICD-10-CM | POA: Diagnosis not present

## 2022-01-15 DIAGNOSIS — J3081 Allergic rhinitis due to animal (cat) (dog) hair and dander: Secondary | ICD-10-CM | POA: Diagnosis not present

## 2022-01-15 DIAGNOSIS — J301 Allergic rhinitis due to pollen: Secondary | ICD-10-CM | POA: Diagnosis not present

## 2022-01-15 DIAGNOSIS — J3089 Other allergic rhinitis: Secondary | ICD-10-CM | POA: Diagnosis not present

## 2022-01-20 ENCOUNTER — Ambulatory Visit (INDEPENDENT_AMBULATORY_CARE_PROVIDER_SITE_OTHER): Payer: BC Managed Care – PPO

## 2022-01-20 DIAGNOSIS — E538 Deficiency of other specified B group vitamins: Secondary | ICD-10-CM

## 2022-01-20 MED ORDER — CYANOCOBALAMIN 1000 MCG/ML IJ SOLN
1000.0000 ug | INTRAMUSCULAR | Status: AC
  Administered 2022-01-20 – 2022-05-14 (×3): 1000 ug via INTRAMUSCULAR

## 2022-01-20 NOTE — Progress Notes (Signed)
Per orders of Isaac Bliss, Rayford Halsted, MD, injection of B12 given in right deltoid by Franco Collet. Patient tolerated injection well.  Lab Results  Component Value Date   VITAMINB12 206 (L) 11/26/2021

## 2022-01-20 NOTE — Patient Instructions (Signed)
Health Maintenance Due  Topic Date Due   HIV Screening  Never done   Hepatitis C Screening  Never done   COVID-19 Vaccine (4 - Booster for Janssen series) 08/05/2020   TETANUS/TDAP  09/05/2020   PAP SMEAR-Modifier  03/08/2021   INFLUENZA VACCINE  10/06/2021      Row Labels 11/26/2021    8:44 AM 10/21/2020    8:07 AM  Depression screen PHQ 2/9   Section Header. No data exists in this row.    Decreased Interest   0 0  Down, Depressed, Hopeless   0   PHQ - 2 Score   0 0  Altered sleeping   0 0  Tired, decreased energy   1 0  Change in appetite   0 0  Feeling bad or failure about yourself    0 0  Trouble concentrating   0 0  Moving slowly or fidgety/restless   0 0  Suicidal thoughts   0 0  PHQ-9 Score   1 0  Difficult doing work/chores   Not difficult at all Not difficult at all

## 2022-01-21 ENCOUNTER — Ambulatory Visit: Payer: BC Managed Care – PPO

## 2022-02-01 DIAGNOSIS — J301 Allergic rhinitis due to pollen: Secondary | ICD-10-CM | POA: Diagnosis not present

## 2022-02-01 DIAGNOSIS — J3081 Allergic rhinitis due to animal (cat) (dog) hair and dander: Secondary | ICD-10-CM | POA: Diagnosis not present

## 2022-02-01 DIAGNOSIS — J3089 Other allergic rhinitis: Secondary | ICD-10-CM | POA: Diagnosis not present

## 2022-02-03 DIAGNOSIS — J301 Allergic rhinitis due to pollen: Secondary | ICD-10-CM | POA: Diagnosis not present

## 2022-02-04 DIAGNOSIS — J3089 Other allergic rhinitis: Secondary | ICD-10-CM | POA: Diagnosis not present

## 2022-02-16 DIAGNOSIS — J3089 Other allergic rhinitis: Secondary | ICD-10-CM | POA: Diagnosis not present

## 2022-02-16 DIAGNOSIS — J3081 Allergic rhinitis due to animal (cat) (dog) hair and dander: Secondary | ICD-10-CM | POA: Diagnosis not present

## 2022-02-16 DIAGNOSIS — J301 Allergic rhinitis due to pollen: Secondary | ICD-10-CM | POA: Diagnosis not present

## 2022-02-19 ENCOUNTER — Telehealth: Payer: Self-pay | Admitting: Internal Medicine

## 2022-02-19 ENCOUNTER — Other Ambulatory Visit: Payer: BC Managed Care – PPO

## 2022-02-19 ENCOUNTER — Ambulatory Visit (INDEPENDENT_AMBULATORY_CARE_PROVIDER_SITE_OTHER): Payer: BC Managed Care – PPO

## 2022-02-19 DIAGNOSIS — E538 Deficiency of other specified B group vitamins: Secondary | ICD-10-CM

## 2022-02-19 NOTE — Progress Notes (Signed)
Per orders of Dr. Burchette, injection of Cyanocobalamin 1000 mcg given by Leshon Armistead L Rani Idler. Patient tolerated injection well.  

## 2022-02-19 NOTE — Telephone Encounter (Signed)
Patient wanted to know if she could have her B12 levels checked to see if she still needed to continue with monthly injections or if she can switch to something OTC. She would like to have this checked before the end of the year for insurance.       Please advise

## 2022-02-22 NOTE — Telephone Encounter (Signed)
I would continue monthly for 6 months and recheck levels then to decide next steps   See above for Dr Hernandez's response.   Pt advised of above & verb understanding. Denies further ques/concerns at this time.

## 2022-02-28 ENCOUNTER — Other Ambulatory Visit: Payer: Self-pay | Admitting: Internal Medicine

## 2022-03-03 DIAGNOSIS — J3089 Other allergic rhinitis: Secondary | ICD-10-CM | POA: Diagnosis not present

## 2022-03-03 DIAGNOSIS — J301 Allergic rhinitis due to pollen: Secondary | ICD-10-CM | POA: Diagnosis not present

## 2022-03-16 ENCOUNTER — Ambulatory Visit (INDEPENDENT_AMBULATORY_CARE_PROVIDER_SITE_OTHER): Payer: BC Managed Care – PPO | Admitting: Podiatry

## 2022-03-16 DIAGNOSIS — L600 Ingrowing nail: Secondary | ICD-10-CM

## 2022-03-16 NOTE — Progress Notes (Signed)
Subjective:  Patient ID: Kristy Benton, female    DOB: 1980-06-21,  MRN: [id number]  Chief Complaint  Patient presents with   Nail Problem    Right hallux nail discomfort on the outside of the toe went and had a pedicure done and was told she had bunch of dead skin felt better for about a day and now is still having discomfort     42 y.o. female presents with the above complaint.  Patient presents with right hallux medial border ingrown pain for touch is progressive gotten worse.  Patient has bunch of dead skin.  She states she went to pedicure.  Now is having discomfort she wanted to have it removed she denies any other acute complaints pain scale 7 out of 10 dull aching nature.   Review of Systems: Negative except as noted in the HPI. Denies N/V/F/Ch.  Past Medical History:  Diagnosis Date   Depression    Uterine fibroid     Current Outpatient Medications:    buPROPion (WELLBUTRIN XL) 150 MG 24 hr tablet, TAKE 1 TABLET BY MOUTH DAILY, Disp: 90 tablet, Rfl: 1   Cholecalciferol (VITAMIN D) 50 MCG (2000 UT) tablet, Take 2,000 Units by mouth daily., Disp: , Rfl:    ferrous sulfate 325 (65 FE) MG tablet, Take 325 mg by mouth every other day., Disp: , Rfl:    fexofenadine (ALLEGRA) 180 MG tablet, Take 180 mg by mouth daily., Disp: , Rfl:    fluticasone (FLONASE) 50 MCG/ACT nasal spray, Place 2 sprays into both nostrils daily., Disp: , Rfl:    methocarbamol (ROBAXIN) 500 MG tablet, Take 1 tablet (500 mg total) by mouth 2 (two) times daily., Disp: 20 tablet, Rfl: 0   montelukast (SINGULAIR) 10 MG tablet, Take 10 mg by mouth at bedtime., Disp: , Rfl:    Probiotic Product (PROBIOTIC-10 PO), Take by mouth., Disp: , Rfl:   Current Facility-Administered Medications:    cyanocobalamin (VITAMIN B12) injection 1,000 mcg, 1,000 mcg, Intramuscular, Q30 days, Isaac Bliss, Rayford Halsted, MD, 1,000 mcg at 02/19/22 1544  Social History   Tobacco Use  Smoking Status Never  Smokeless Tobacco  Never    Allergies  Allergen Reactions   Amoxicillin Hives   Objective:  There were no vitals filed for this visit. There is no height or weight on file to calculate BMI. Constitutional Well developed. Well nourished.  Vascular Dorsalis pedis pulses palpable bilaterally. Posterior tibial pulses palpable bilaterally. Capillary refill normal to all digits.  No cyanosis or clubbing noted. Pedal hair growth normal.  Neurologic Normal speech. Oriented to person, place, and time. Epicritic sensation to light touch grossly present bilaterally.  Dermatologic Painful ingrowing nail at medial nail borders of the hallux nail right. No other open wounds. No skin lesions.  Orthopedic: Normal joint ROM without pain or crepitus bilaterally. No visible deformities. No bony tenderness.   Radiographs: None Assessment:   1. Ingrown toenail of right foot    Plan:  Patient was evaluated and treated and all questions answered.  Ingrown Nail, right -Patient elects to proceed with minor surgery to remove ingrown toenail removal today. Consent reviewed and signed by patient. -Ingrown nail excised. See procedure note. -Educated on post-procedure care including soaking. Written instructions provided and reviewed. -Patient to follow up in 2 weeks for nail check.  Procedure: Excision of Ingrown Toenail Location: Right 1st toe medial nail borders. Anesthesia: Lidocaine 1% plain; 1.5 mL and Marcaine 0.5% plain; 1.5 mL, digital block. Skin Prep: Betadine. Dressing: Silvadene;  telfa; dry, sterile, compression dressing. Technique: Following skin prep, the toe was exsanguinated and a tourniquet was secured at the base of the toe. The affected nail border was freed, split with a nail splitter, and excised. Chemical matrixectomy was then performed with phenol and irrigated out with alcohol. The tourniquet was then removed and sterile dressing applied. Disposition: Patient tolerated procedure well. Patient  to return in 2 weeks for follow-up.   No follow-ups on file.

## 2022-03-17 ENCOUNTER — Ambulatory Visit (INDEPENDENT_AMBULATORY_CARE_PROVIDER_SITE_OTHER): Payer: BC Managed Care – PPO | Admitting: *Deleted

## 2022-03-17 DIAGNOSIS — J3081 Allergic rhinitis due to animal (cat) (dog) hair and dander: Secondary | ICD-10-CM | POA: Diagnosis not present

## 2022-03-17 DIAGNOSIS — J301 Allergic rhinitis due to pollen: Secondary | ICD-10-CM | POA: Diagnosis not present

## 2022-03-17 DIAGNOSIS — E538 Deficiency of other specified B group vitamins: Secondary | ICD-10-CM | POA: Diagnosis not present

## 2022-03-17 DIAGNOSIS — J3089 Other allergic rhinitis: Secondary | ICD-10-CM | POA: Diagnosis not present

## 2022-03-17 MED ORDER — CYANOCOBALAMIN 1000 MCG/ML IJ SOLN
1000.0000 ug | Freq: Once | INTRAMUSCULAR | Status: AC
  Administered 2022-03-17: 1000 ug via INTRAMUSCULAR

## 2022-03-17 NOTE — Progress Notes (Signed)
Per orders of Dr. Jerilee Hoh, injection of Cyanocobalamin 1021mg given by FLahoma CrockerA and verbal approval was given to administer the injection 5 days early per PCP. Patient tolerated injection well.

## 2022-03-29 DIAGNOSIS — J3089 Other allergic rhinitis: Secondary | ICD-10-CM | POA: Diagnosis not present

## 2022-03-29 DIAGNOSIS — J3081 Allergic rhinitis due to animal (cat) (dog) hair and dander: Secondary | ICD-10-CM | POA: Diagnosis not present

## 2022-03-29 DIAGNOSIS — J301 Allergic rhinitis due to pollen: Secondary | ICD-10-CM | POA: Diagnosis not present

## 2022-04-16 ENCOUNTER — Ambulatory Visit (INDEPENDENT_AMBULATORY_CARE_PROVIDER_SITE_OTHER): Payer: BC Managed Care – PPO | Admitting: *Deleted

## 2022-04-16 DIAGNOSIS — J3089 Other allergic rhinitis: Secondary | ICD-10-CM | POA: Diagnosis not present

## 2022-04-16 DIAGNOSIS — E538 Deficiency of other specified B group vitamins: Secondary | ICD-10-CM | POA: Diagnosis not present

## 2022-04-16 DIAGNOSIS — J301 Allergic rhinitis due to pollen: Secondary | ICD-10-CM | POA: Diagnosis not present

## 2022-04-16 DIAGNOSIS — J3081 Allergic rhinitis due to animal (cat) (dog) hair and dander: Secondary | ICD-10-CM | POA: Diagnosis not present

## 2022-04-16 MED ORDER — CYANOCOBALAMIN 1000 MCG/ML IJ SOLN
1000.0000 ug | Freq: Once | INTRAMUSCULAR | Status: AC
  Administered 2022-04-16: 1000 ug via INTRAMUSCULAR

## 2022-04-16 NOTE — Progress Notes (Cosign Needed)
Per orders of Dr. Legrand Como, injection of Cyanocobalamin 1091mg given by FAgnes Lawrence Patient tolerated injection well.

## 2022-05-05 DIAGNOSIS — J3089 Other allergic rhinitis: Secondary | ICD-10-CM | POA: Diagnosis not present

## 2022-05-05 DIAGNOSIS — J3081 Allergic rhinitis due to animal (cat) (dog) hair and dander: Secondary | ICD-10-CM | POA: Diagnosis not present

## 2022-05-05 DIAGNOSIS — J301 Allergic rhinitis due to pollen: Secondary | ICD-10-CM | POA: Diagnosis not present

## 2022-05-14 ENCOUNTER — Ambulatory Visit (INDEPENDENT_AMBULATORY_CARE_PROVIDER_SITE_OTHER): Payer: BC Managed Care – PPO

## 2022-05-14 ENCOUNTER — Telehealth: Payer: Self-pay

## 2022-05-14 DIAGNOSIS — E538 Deficiency of other specified B group vitamins: Secondary | ICD-10-CM

## 2022-05-14 NOTE — Progress Notes (Signed)
Per orders of Dr. Elease Hashimoto, injection of Cyanocobalamin inj.1000 mcg given by Encarnacion Slates on Left arm.   Patient tolerated injection well.

## 2022-05-14 NOTE — Telephone Encounter (Signed)
Pt was here for monthly VitB 12 injection. Pt questioned when will be her last injection and when will her lab recheck be?  Please advise.

## 2022-05-20 DIAGNOSIS — J3081 Allergic rhinitis due to animal (cat) (dog) hair and dander: Secondary | ICD-10-CM | POA: Diagnosis not present

## 2022-05-20 DIAGNOSIS — J3089 Other allergic rhinitis: Secondary | ICD-10-CM | POA: Diagnosis not present

## 2022-05-20 DIAGNOSIS — J301 Allergic rhinitis due to pollen: Secondary | ICD-10-CM | POA: Diagnosis not present

## 2022-05-26 DIAGNOSIS — J3089 Other allergic rhinitis: Secondary | ICD-10-CM | POA: Diagnosis not present

## 2022-05-26 DIAGNOSIS — J301 Allergic rhinitis due to pollen: Secondary | ICD-10-CM | POA: Diagnosis not present

## 2022-05-26 DIAGNOSIS — J3081 Allergic rhinitis due to animal (cat) (dog) hair and dander: Secondary | ICD-10-CM | POA: Diagnosis not present

## 2022-06-01 DIAGNOSIS — J301 Allergic rhinitis due to pollen: Secondary | ICD-10-CM | POA: Diagnosis not present

## 2022-06-01 DIAGNOSIS — J3089 Other allergic rhinitis: Secondary | ICD-10-CM | POA: Diagnosis not present

## 2022-06-01 DIAGNOSIS — J3081 Allergic rhinitis due to animal (cat) (dog) hair and dander: Secondary | ICD-10-CM | POA: Diagnosis not present

## 2022-06-02 DIAGNOSIS — J069 Acute upper respiratory infection, unspecified: Secondary | ICD-10-CM | POA: Diagnosis not present

## 2022-06-03 DIAGNOSIS — R070 Pain in throat: Secondary | ICD-10-CM | POA: Diagnosis not present

## 2022-06-03 DIAGNOSIS — J029 Acute pharyngitis, unspecified: Secondary | ICD-10-CM | POA: Diagnosis not present

## 2022-06-14 ENCOUNTER — Ambulatory Visit (INDEPENDENT_AMBULATORY_CARE_PROVIDER_SITE_OTHER): Payer: BC Managed Care – PPO

## 2022-06-14 ENCOUNTER — Telehealth: Payer: Self-pay | Admitting: Internal Medicine

## 2022-06-14 DIAGNOSIS — J301 Allergic rhinitis due to pollen: Secondary | ICD-10-CM | POA: Diagnosis not present

## 2022-06-14 DIAGNOSIS — E538 Deficiency of other specified B group vitamins: Secondary | ICD-10-CM | POA: Diagnosis not present

## 2022-06-14 DIAGNOSIS — J3089 Other allergic rhinitis: Secondary | ICD-10-CM | POA: Diagnosis not present

## 2022-06-14 DIAGNOSIS — J3081 Allergic rhinitis due to animal (cat) (dog) hair and dander: Secondary | ICD-10-CM | POA: Diagnosis not present

## 2022-06-14 MED ORDER — CYANOCOBALAMIN 1000 MCG/ML IJ SOLN
1000.0000 ug | Freq: Once | INTRAMUSCULAR | Status: AC
  Administered 2022-06-14: 1000 ug via INTRAMUSCULAR

## 2022-06-14 NOTE — Progress Notes (Signed)
Per orders of Dr. Fry, injection of Cyanocobalamin 1000 mcg given by Sylus Stgermain L Wannetta Langland. °Patient tolerated injection well.  °

## 2022-06-14 NOTE — Telephone Encounter (Signed)
Pt would like to know if she needs to continue her B12 shots.

## 2022-06-16 NOTE — Telephone Encounter (Signed)
Spoke with the patient and informed her of the message below.  Since it has been 6 months, a lab appt was scheduled for 4/12.

## 2022-06-18 ENCOUNTER — Other Ambulatory Visit: Payer: BC Managed Care – PPO

## 2022-06-18 DIAGNOSIS — E538 Deficiency of other specified B group vitamins: Secondary | ICD-10-CM | POA: Diagnosis not present

## 2022-06-18 NOTE — Addendum Note (Signed)
Addended by: Marian Sorrow D on: 06/18/2022 03:25 PM   Modules accepted: Orders

## 2022-06-19 LAB — VITAMIN B12: Vitamin B-12: 670 pg/mL (ref 200–1100)

## 2022-06-21 DIAGNOSIS — Z01419 Encounter for gynecological examination (general) (routine) without abnormal findings: Secondary | ICD-10-CM | POA: Diagnosis not present

## 2022-06-21 DIAGNOSIS — Z113 Encounter for screening for infections with a predominantly sexual mode of transmission: Secondary | ICD-10-CM | POA: Diagnosis not present

## 2022-06-21 DIAGNOSIS — Z114 Encounter for screening for human immunodeficiency virus [HIV]: Secondary | ICD-10-CM | POA: Diagnosis not present

## 2022-06-21 DIAGNOSIS — Z1231 Encounter for screening mammogram for malignant neoplasm of breast: Secondary | ICD-10-CM | POA: Diagnosis not present

## 2022-06-21 DIAGNOSIS — Z118 Encounter for screening for other infectious and parasitic diseases: Secondary | ICD-10-CM | POA: Diagnosis not present

## 2022-06-21 DIAGNOSIS — Z1159 Encounter for screening for other viral diseases: Secondary | ICD-10-CM | POA: Diagnosis not present

## 2022-06-21 LAB — HM MAMMOGRAPHY

## 2022-06-22 DIAGNOSIS — J3089 Other allergic rhinitis: Secondary | ICD-10-CM | POA: Diagnosis not present

## 2022-06-22 DIAGNOSIS — J301 Allergic rhinitis due to pollen: Secondary | ICD-10-CM | POA: Diagnosis not present

## 2022-06-22 DIAGNOSIS — J3081 Allergic rhinitis due to animal (cat) (dog) hair and dander: Secondary | ICD-10-CM | POA: Diagnosis not present

## 2022-06-28 ENCOUNTER — Ambulatory Visit (INDEPENDENT_AMBULATORY_CARE_PROVIDER_SITE_OTHER): Payer: BC Managed Care – PPO | Admitting: Podiatry

## 2022-06-28 ENCOUNTER — Encounter: Payer: Self-pay | Admitting: Podiatry

## 2022-06-28 DIAGNOSIS — L600 Ingrowing nail: Secondary | ICD-10-CM

## 2022-06-28 NOTE — Progress Notes (Signed)
  Subjective:  Patient ID: Kristy Benton, female    DOB: [birth year],   MRN: [id number]  Chief Complaint  Patient presents with   Ingrown Toenail     Left great toenail on going since February     42 y.o. female presents for cocnern of left great ingrown toenail that has been bothering her since February. Relates she did have it trimmed by pedicurist and has been doing well past two days and today not having any pain Relates she has had the right ingrown worked on by Dr. Allena Katz in the past and doing well.   . Denies any other pedal complaints. Denies n/v/f/c.   Past Medical History:  Diagnosis Date   Depression    Uterine fibroid     Objective:  Physical Exam: Vascular: DP/PT pulses 2/4 bilateral. CFT <3 seconds. Normal hair growth on digits. No edema.  Skin. No lacerations or abrasions bilateral feet. Mild incurvation noted proximal medial border left hallux. No erythema edema or purulence noted. No pain to palpation Musculoskeletal: MMT 5/5 bilateral lower extremities in DF, PF, Inversion and Eversion. Deceased ROM in DF of ankle joint.  Neurological: Sensation intact to light touch.   Assessment:   1. Ingrown left greater toenail      Plan:  Patient was evaluated and treated and all questions answered. Discussed ingrown toenails etiology and treatment options including procedure for removal vs conservative care.  Patient hesitant to want to do a procedure today. Discussed in detail prevention of ingrowns and cutting as well as soaks.  Patient to return in the future if needing procedure done.    Louann Sjogren, DPM

## 2022-07-01 DIAGNOSIS — R059 Cough, unspecified: Secondary | ICD-10-CM | POA: Diagnosis not present

## 2022-07-01 DIAGNOSIS — J3089 Other allergic rhinitis: Secondary | ICD-10-CM | POA: Diagnosis not present

## 2022-07-01 DIAGNOSIS — J301 Allergic rhinitis due to pollen: Secondary | ICD-10-CM | POA: Diagnosis not present

## 2022-07-01 DIAGNOSIS — H1045 Other chronic allergic conjunctivitis: Secondary | ICD-10-CM | POA: Diagnosis not present

## 2022-07-09 DIAGNOSIS — J301 Allergic rhinitis due to pollen: Secondary | ICD-10-CM | POA: Diagnosis not present

## 2022-07-09 DIAGNOSIS — J3089 Other allergic rhinitis: Secondary | ICD-10-CM | POA: Diagnosis not present

## 2022-07-16 DIAGNOSIS — J301 Allergic rhinitis due to pollen: Secondary | ICD-10-CM | POA: Diagnosis not present

## 2022-07-16 DIAGNOSIS — J3089 Other allergic rhinitis: Secondary | ICD-10-CM | POA: Diagnosis not present

## 2022-07-23 DIAGNOSIS — J3081 Allergic rhinitis due to animal (cat) (dog) hair and dander: Secondary | ICD-10-CM | POA: Diagnosis not present

## 2022-07-23 DIAGNOSIS — J3089 Other allergic rhinitis: Secondary | ICD-10-CM | POA: Diagnosis not present

## 2022-07-23 DIAGNOSIS — J301 Allergic rhinitis due to pollen: Secondary | ICD-10-CM | POA: Diagnosis not present

## 2022-08-05 DIAGNOSIS — J3081 Allergic rhinitis due to animal (cat) (dog) hair and dander: Secondary | ICD-10-CM | POA: Diagnosis not present

## 2022-08-05 DIAGNOSIS — J301 Allergic rhinitis due to pollen: Secondary | ICD-10-CM | POA: Diagnosis not present

## 2022-08-05 DIAGNOSIS — J3089 Other allergic rhinitis: Secondary | ICD-10-CM | POA: Diagnosis not present

## 2022-08-11 DIAGNOSIS — J069 Acute upper respiratory infection, unspecified: Secondary | ICD-10-CM | POA: Diagnosis not present

## 2022-08-11 DIAGNOSIS — J040 Acute laryngitis: Secondary | ICD-10-CM | POA: Diagnosis not present

## 2022-08-12 DIAGNOSIS — J0110 Acute frontal sinusitis, unspecified: Secondary | ICD-10-CM | POA: Diagnosis not present

## 2022-08-20 DIAGNOSIS — J209 Acute bronchitis, unspecified: Secondary | ICD-10-CM | POA: Diagnosis not present

## 2022-08-28 ENCOUNTER — Other Ambulatory Visit: Payer: Self-pay | Admitting: Internal Medicine

## 2022-08-30 DIAGNOSIS — J3081 Allergic rhinitis due to animal (cat) (dog) hair and dander: Secondary | ICD-10-CM | POA: Diagnosis not present

## 2022-08-30 DIAGNOSIS — J3089 Other allergic rhinitis: Secondary | ICD-10-CM | POA: Diagnosis not present

## 2022-08-30 DIAGNOSIS — J301 Allergic rhinitis due to pollen: Secondary | ICD-10-CM | POA: Diagnosis not present

## 2022-09-13 DIAGNOSIS — J3089 Other allergic rhinitis: Secondary | ICD-10-CM | POA: Diagnosis not present

## 2022-09-13 DIAGNOSIS — J301 Allergic rhinitis due to pollen: Secondary | ICD-10-CM | POA: Diagnosis not present

## 2022-09-24 DIAGNOSIS — J3089 Other allergic rhinitis: Secondary | ICD-10-CM | POA: Diagnosis not present

## 2022-09-24 DIAGNOSIS — J3081 Allergic rhinitis due to animal (cat) (dog) hair and dander: Secondary | ICD-10-CM | POA: Diagnosis not present

## 2022-09-24 DIAGNOSIS — J301 Allergic rhinitis due to pollen: Secondary | ICD-10-CM | POA: Diagnosis not present

## 2022-09-29 DIAGNOSIS — J3081 Allergic rhinitis due to animal (cat) (dog) hair and dander: Secondary | ICD-10-CM | POA: Diagnosis not present

## 2022-09-29 DIAGNOSIS — J301 Allergic rhinitis due to pollen: Secondary | ICD-10-CM | POA: Diagnosis not present

## 2022-09-29 DIAGNOSIS — J3089 Other allergic rhinitis: Secondary | ICD-10-CM | POA: Diagnosis not present

## 2022-10-06 DIAGNOSIS — J301 Allergic rhinitis due to pollen: Secondary | ICD-10-CM | POA: Diagnosis not present

## 2022-10-06 DIAGNOSIS — J3081 Allergic rhinitis due to animal (cat) (dog) hair and dander: Secondary | ICD-10-CM | POA: Diagnosis not present

## 2022-10-06 DIAGNOSIS — J3089 Other allergic rhinitis: Secondary | ICD-10-CM | POA: Diagnosis not present

## 2022-10-15 DIAGNOSIS — J3081 Allergic rhinitis due to animal (cat) (dog) hair and dander: Secondary | ICD-10-CM | POA: Diagnosis not present

## 2022-10-15 DIAGNOSIS — J3089 Other allergic rhinitis: Secondary | ICD-10-CM | POA: Diagnosis not present

## 2022-10-15 DIAGNOSIS — J301 Allergic rhinitis due to pollen: Secondary | ICD-10-CM | POA: Diagnosis not present

## 2022-10-28 DIAGNOSIS — J301 Allergic rhinitis due to pollen: Secondary | ICD-10-CM | POA: Diagnosis not present

## 2022-10-28 DIAGNOSIS — J3081 Allergic rhinitis due to animal (cat) (dog) hair and dander: Secondary | ICD-10-CM | POA: Diagnosis not present

## 2022-10-28 DIAGNOSIS — J3089 Other allergic rhinitis: Secondary | ICD-10-CM | POA: Diagnosis not present

## 2022-11-04 DIAGNOSIS — R2231 Localized swelling, mass and lump, right upper limb: Secondary | ICD-10-CM | POA: Diagnosis not present

## 2022-11-11 DIAGNOSIS — H5043 Accommodative component in esotropia: Secondary | ICD-10-CM | POA: Insufficient documentation

## 2022-11-11 DIAGNOSIS — H5203 Hypermetropia, bilateral: Secondary | ICD-10-CM | POA: Diagnosis not present

## 2022-11-11 DIAGNOSIS — H532 Diplopia: Secondary | ICD-10-CM | POA: Diagnosis not present

## 2022-11-12 DIAGNOSIS — D2262 Melanocytic nevi of left upper limb, including shoulder: Secondary | ICD-10-CM | POA: Diagnosis not present

## 2022-11-12 DIAGNOSIS — J3081 Allergic rhinitis due to animal (cat) (dog) hair and dander: Secondary | ICD-10-CM | POA: Diagnosis not present

## 2022-11-12 DIAGNOSIS — L239 Allergic contact dermatitis, unspecified cause: Secondary | ICD-10-CM | POA: Diagnosis not present

## 2022-11-12 DIAGNOSIS — J301 Allergic rhinitis due to pollen: Secondary | ICD-10-CM | POA: Diagnosis not present

## 2022-11-12 DIAGNOSIS — L7211 Pilar cyst: Secondary | ICD-10-CM | POA: Diagnosis not present

## 2022-11-12 DIAGNOSIS — J3089 Other allergic rhinitis: Secondary | ICD-10-CM | POA: Diagnosis not present

## 2022-11-12 DIAGNOSIS — D224 Melanocytic nevi of scalp and neck: Secondary | ICD-10-CM | POA: Diagnosis not present

## 2022-11-24 DIAGNOSIS — J3081 Allergic rhinitis due to animal (cat) (dog) hair and dander: Secondary | ICD-10-CM | POA: Diagnosis not present

## 2022-11-24 DIAGNOSIS — J3089 Other allergic rhinitis: Secondary | ICD-10-CM | POA: Diagnosis not present

## 2022-11-24 DIAGNOSIS — J301 Allergic rhinitis due to pollen: Secondary | ICD-10-CM | POA: Diagnosis not present

## 2022-12-01 ENCOUNTER — Other Ambulatory Visit: Payer: Self-pay | Admitting: Internal Medicine

## 2022-12-01 ENCOUNTER — Encounter: Payer: BC Managed Care – PPO | Admitting: Internal Medicine

## 2022-12-08 ENCOUNTER — Encounter: Payer: BC Managed Care – PPO | Admitting: Internal Medicine

## 2022-12-10 DIAGNOSIS — J301 Allergic rhinitis due to pollen: Secondary | ICD-10-CM | POA: Diagnosis not present

## 2022-12-10 DIAGNOSIS — J3089 Other allergic rhinitis: Secondary | ICD-10-CM | POA: Diagnosis not present

## 2022-12-10 DIAGNOSIS — J3081 Allergic rhinitis due to animal (cat) (dog) hair and dander: Secondary | ICD-10-CM | POA: Diagnosis not present

## 2022-12-14 ENCOUNTER — Encounter: Payer: Self-pay | Admitting: Internal Medicine

## 2022-12-14 ENCOUNTER — Ambulatory Visit (INDEPENDENT_AMBULATORY_CARE_PROVIDER_SITE_OTHER): Payer: BC Managed Care – PPO | Admitting: Internal Medicine

## 2022-12-14 VITALS — BP 98/68 | HR 58 | Temp 98.0°F | Ht 68.0 in | Wt 149.1 lb

## 2022-12-14 DIAGNOSIS — F419 Anxiety disorder, unspecified: Secondary | ICD-10-CM | POA: Diagnosis not present

## 2022-12-14 DIAGNOSIS — Z0000 Encounter for general adult medical examination without abnormal findings: Secondary | ICD-10-CM | POA: Diagnosis not present

## 2022-12-14 DIAGNOSIS — E538 Deficiency of other specified B group vitamins: Secondary | ICD-10-CM | POA: Diagnosis not present

## 2022-12-14 DIAGNOSIS — Z114 Encounter for screening for human immunodeficiency virus [HIV]: Secondary | ICD-10-CM

## 2022-12-14 DIAGNOSIS — D259 Leiomyoma of uterus, unspecified: Secondary | ICD-10-CM | POA: Insufficient documentation

## 2022-12-14 DIAGNOSIS — Z90710 Acquired absence of both cervix and uterus: Secondary | ICD-10-CM | POA: Insufficient documentation

## 2022-12-14 DIAGNOSIS — Z1159 Encounter for screening for other viral diseases: Secondary | ICD-10-CM

## 2022-12-14 LAB — VITAMIN B12: Vitamin B-12: >1501 pg/mL — ABNORMAL HIGH (ref 211–911)

## 2022-12-14 LAB — CBC WITH DIFFERENTIAL/PLATELET
Basophils Absolute: 0.1 10*3/uL (ref 0.0–0.1)
Basophils Relative: 1 % (ref 0.0–3.0)
Eosinophils Absolute: 0 10*3/uL (ref 0.0–0.7)
Eosinophils Relative: 0.4 % (ref 0.0–5.0)
HCT: 40.6 % (ref 36.0–46.0)
Hemoglobin: 13.3 g/dL (ref 12.0–15.0)
Lymphocytes Relative: 32.4 % (ref 12.0–46.0)
Lymphs Abs: 1.7 10*3/uL (ref 0.7–4.0)
MCHC: 32.8 g/dL (ref 30.0–36.0)
MCV: 93.8 fL (ref 78.0–100.0)
Monocytes Absolute: 0.4 10*3/uL (ref 0.1–1.0)
Monocytes Relative: 8.1 % (ref 3.0–12.0)
Neutro Abs: 3.1 10*3/uL (ref 1.4–7.7)
Neutrophils Relative %: 58.1 % (ref 43.0–77.0)
Platelets: 270 10*3/uL (ref 150.0–400.0)
RBC: 4.32 Mil/uL (ref 3.87–5.11)
RDW: 13.5 % (ref 11.5–15.5)
WBC: 5.3 10*3/uL (ref 4.0–10.5)

## 2022-12-14 LAB — COMPREHENSIVE METABOLIC PANEL
ALT: 10 U/L (ref 0–35)
AST: 15 U/L (ref 0–37)
Albumin: 4.4 g/dL (ref 3.5–5.2)
Alkaline Phosphatase: 51 U/L (ref 39–117)
BUN: 15 mg/dL (ref 6–23)
CO2: 25 meq/L (ref 19–32)
Calcium: 9.4 mg/dL (ref 8.4–10.5)
Chloride: 106 meq/L (ref 96–112)
Creatinine, Ser: 0.92 mg/dL (ref 0.40–1.20)
GFR: 76.43 mL/min (ref >60.00)
Glucose, Bld: 86 mg/dL (ref 70–99)
Potassium: 4 meq/L (ref 3.5–5.1)
Sodium: 140 meq/L (ref 135–145)
Total Bilirubin: 0.6 mg/dL (ref 0.2–1.2)
Total Protein: 6.8 g/dL (ref 6.0–8.3)

## 2022-12-14 LAB — LIPID PANEL
Cholesterol: 155 mg/dL (ref 0–200)
HDL: 70.2 mg/dL (ref >39.00)
LDL Cholesterol: 74 mg/dL (ref 0–99)
NonHDL: 85.08
Total CHOL/HDL Ratio: 2
Triglycerides: 54 mg/dL (ref 0.0–149.0)
VLDL: 10.8 mg/dL (ref 0.0–40.0)

## 2022-12-14 LAB — HEMOGLOBIN A1C: Hgb A1c MFr Bld: 5.3 % (ref 4.6–6.5)

## 2022-12-14 LAB — VITAMIN D 25 HYDROXY (VIT D DEFICIENCY, FRACTURES): VITD: 32.54 ng/mL (ref 30.00–100.00)

## 2022-12-14 LAB — TSH: TSH: 1.35 u[IU]/mL (ref 0.35–5.50)

## 2022-12-14 MED ORDER — ALPRAZOLAM 0.5 MG PO TABS
0.5000 mg | ORAL_TABLET | Freq: Every day | ORAL | 0 refills | Status: DC | PRN
Start: 2022-12-14 — End: 2023-03-07

## 2022-12-14 NOTE — Progress Notes (Signed)
Established Patient Office Visit     CC/Reason for Visit: Annual preventive exam, discuss acute concern  HPI: Kristy Benton is a 42 y.o. female who is coming in today for the above mentioned reasons. Past Medical History is significant for: Depression that has been well-controlled on Wellbutrin.  She has also been dealing with some panic attacks.  She has used Xanax in the past for this and has worked well.  It has been years since she has had 1.  Has routine eye and dental care.  Is due for Tdap, flu, COVID vaccines.  Had cervical and breast cancer screening with GYN in April.   Past Medical/Surgical History: Past Medical History:  Diagnosis Date   Depression    Uterine fibroid     Past Surgical History:  Procedure Laterality Date   MYOMECTOMY  11/03/2018    Social History:  reports that she has never smoked. She has never used smokeless tobacco. She reports current alcohol use. She reports that she does not use drugs.  Allergies: Allergies  Allergen Reactions   Amoxicillin Hives and Other (See Comments)    Family History:  Family History  Problem Relation Age of Onset   Coronary artery disease Paternal Grandfather      Current Outpatient Medications:    albuterol (VENTOLIN HFA) 108 (90 Base) MCG/ACT inhaler, Inhale into the lungs., Disp: , Rfl:    ALPRAZolam (XANAX) 0.5 MG tablet, Take 1 tablet (0.5 mg total) by mouth daily as needed for anxiety., Disp: 10 tablet, Rfl: 0   Bacillus Coagulans-Inulin (PROBIOTIC) 1-250 BILLION-MG CAPS, , Disp: , Rfl:    buPROPion (WELLBUTRIN XL) 150 MG 24 hr tablet, TAKE 1 TABLET BY MOUTH DAILY, Disp: 90 tablet, Rfl: 0   Carboxymethylcellulose Sod PF (REFRESH PLUS) 0.5 % SOLN, 1 drop 2 (two) times a day., Disp: , Rfl:    Cholecalciferol (VITAMIN D) 50 MCG (2000 UT) tablet, Take 2,000 Units by mouth daily., Disp: , Rfl:    desloratadine (CLARINEX) 5 MG tablet, Take 5 mg by mouth daily., Disp: , Rfl:    EPINEPHrine 0.3 mg/0.3 mL  IJ SOAJ injection, , Disp: , Rfl:    fexofenadine (ALLEGRA) 180 MG tablet, Take 180 mg by mouth daily., Disp: , Rfl:    fluticasone (FLONASE) 50 MCG/ACT nasal spray, Place 2 sprays into both nostrils daily., Disp: , Rfl:    LACTOBACILLUS PO, Take by mouth., Disp: , Rfl:    montelukast (SINGULAIR) 10 MG tablet, Take 10 mg by mouth at bedtime., Disp: , Rfl:    Probiotic Product (PROBIOTIC-10 PO), Take by mouth., Disp: , Rfl:    ferrous sulfate 325 (65 FE) MG tablet, Take 325 mg by mouth every other day. (Patient not taking: Reported on 12/14/2022), Disp: , Rfl:    ipratropium (ATROVENT) 0.03 % nasal spray, Place into both nostrils. (Patient not taking: Reported on 12/14/2022), Disp: , Rfl:   Review of Systems:  Negative unless indicated in HPI.   Physical Exam: Vitals:   12/14/22 0804  BP: 98/68  Pulse: (!) 58  Temp: 98 F (36.7 C)  TempSrc: Oral  SpO2: 99%  Weight: 149 lb 1.6 oz (67.6 kg)  Height: 5\' 8"  (1.727 m)    Body mass index is 22.67 kg/m.   Physical Exam Vitals reviewed.  Constitutional:      General: She is not in acute distress.    Appearance: Normal appearance. She is not ill-appearing, toxic-appearing or diaphoretic.  HENT:  Head: Normocephalic.     Right Ear: Tympanic membrane, ear canal and external ear normal. There is no impacted cerumen.     Left Ear: Tympanic membrane, ear canal and external ear normal. There is no impacted cerumen.     Nose: Nose normal.     Mouth/Throat:     Mouth: Mucous membranes are moist.     Pharynx: Oropharynx is clear. No oropharyngeal exudate or posterior oropharyngeal erythema.  Eyes:     General: No scleral icterus.       Right eye: No discharge.        Left eye: No discharge.     Conjunctiva/sclera: Conjunctivae normal.     Pupils: Pupils are equal, round, and reactive to light.  Neck:     Vascular: No carotid bruit.  Cardiovascular:     Rate and Rhythm: Normal rate and regular rhythm.     Pulses: Normal pulses.      Heart sounds: Normal heart sounds.  Pulmonary:     Effort: Pulmonary effort is normal. No respiratory distress.     Breath sounds: Normal breath sounds.  Abdominal:     General: Abdomen is flat. Bowel sounds are normal.     Palpations: Abdomen is soft.  Musculoskeletal:        General: Normal range of motion.     Cervical back: Normal range of motion.  Skin:    General: Skin is warm and dry.  Neurological:     General: No focal deficit present.     Mental Status: She is alert and oriented to person, place, and time. Mental status is at baseline.  Psychiatric:        Mood and Affect: Mood normal.        Behavior: Behavior normal.        Thought Content: Thought content normal.        Judgment: Judgment normal.     Flowsheet Row Office Visit from 12/14/2022 in [facility] [place]  PHQ-9 Total Score 1       Impression and Plan:  Encounter for preventive health examination -     CBC with Differential/Platelet; Future -     Comprehensive metabolic panel; Future -     Lipid panel; Future -     TSH; Future -     VITAMIN D 25 Hydroxy (Vit-D Deficiency, Fractures); Future -     Hemoglobin A1c  Vitamin B12 deficiency -     Vitamin B12; Future  Anxiety -     ALPRAZolam; Take 1 tablet (0.5 mg total) by mouth daily as needed for anxiety.  Dispense: 10 tablet; Refill: 0  Encounter for hepatitis C screening test for low risk patient -     Hepatitis C antibody; Future  Encounter for screening for HIV -     HIV Antibody (routine testing w rflx); Future  -Recommend routine eye and dental care. -Healthy lifestyle discussed in detail. -Labs to be updated today. -Prostate cancer screening: N/A Health Maintenance  Topic Date Due   HIV Screening  Never done   Hepatitis C Screening  Never done   DTaP/Tdap/Td vaccine (2 - Td or Tdap) 09/05/2020   COVID-19 Vaccine (4 - 2023-24 season) 12/30/2022*   Flu Shot  06/06/2023*   HPV Vaccine  Aged Out  *Topic was  postponed. The date shown is not the original due date.     -Declines Tdap, flu, COVID today despite counseling. -All cancer screening is up-to-date. -Xanax 0.5 mg  to use as needed for panic attacks.  10 tablets provided.  She will continue daily Wellbutrin.     Chaya Jan, MD [facility] at [facility]

## 2022-12-24 DIAGNOSIS — J301 Allergic rhinitis due to pollen: Secondary | ICD-10-CM | POA: Diagnosis not present

## 2022-12-24 DIAGNOSIS — J3081 Allergic rhinitis due to animal (cat) (dog) hair and dander: Secondary | ICD-10-CM | POA: Diagnosis not present

## 2022-12-24 DIAGNOSIS — J3089 Other allergic rhinitis: Secondary | ICD-10-CM | POA: Diagnosis not present

## 2023-01-07 DIAGNOSIS — J3089 Other allergic rhinitis: Secondary | ICD-10-CM | POA: Diagnosis not present

## 2023-01-07 DIAGNOSIS — J3081 Allergic rhinitis due to animal (cat) (dog) hair and dander: Secondary | ICD-10-CM | POA: Diagnosis not present

## 2023-01-07 DIAGNOSIS — J301 Allergic rhinitis due to pollen: Secondary | ICD-10-CM | POA: Diagnosis not present

## 2023-01-15 ENCOUNTER — Encounter (HOSPITAL_COMMUNITY): Payer: Self-pay | Admitting: Emergency Medicine

## 2023-01-15 ENCOUNTER — Emergency Department (HOSPITAL_COMMUNITY): Payer: BC Managed Care – PPO

## 2023-01-15 ENCOUNTER — Emergency Department (HOSPITAL_COMMUNITY)
Admission: EM | Admit: 2023-01-15 | Discharge: 2023-01-15 | Disposition: A | Discharge status: Home / Self Care | Payer: BC Managed Care – PPO | Attending: Emergency Medicine | Admitting: Emergency Medicine

## 2023-01-15 ENCOUNTER — Other Ambulatory Visit: Payer: Self-pay

## 2023-01-15 DIAGNOSIS — N838 Other noninflammatory disorders of ovary, fallopian tube and broad ligament: Secondary | ICD-10-CM | POA: Diagnosis not present

## 2023-01-15 DIAGNOSIS — R102 Pelvic and perineal pain: Secondary | ICD-10-CM

## 2023-01-15 DIAGNOSIS — R109 Unspecified abdominal pain: Secondary | ICD-10-CM | POA: Diagnosis not present

## 2023-01-15 DIAGNOSIS — R1032 Left lower quadrant pain: Secondary | ICD-10-CM | POA: Insufficient documentation

## 2023-01-15 DIAGNOSIS — R1030 Lower abdominal pain, unspecified: Secondary | ICD-10-CM | POA: Diagnosis not present

## 2023-01-15 LAB — CBC
HCT: 39.5 % (ref 36.0–46.0)
Hemoglobin: 13.2 g/dL (ref 12.0–15.0)
MCH: 31.4 pg (ref 26.0–34.0)
MCHC: 33.4 g/dL (ref 30.0–36.0)
MCV: 94 fL (ref 80.0–100.0)
Platelets: 260 10*3/uL (ref 150–400)
RBC: 4.2 MIL/uL (ref 3.87–5.11)
RDW: 12.9 % (ref 11.5–15.5)
WBC: 5.4 10*3/uL (ref 4.0–10.5)
nRBC: 0 % (ref 0.0–0.2)

## 2023-01-15 LAB — URINALYSIS, ROUTINE W REFLEX MICROSCOPIC
Bilirubin Urine: NEGATIVE
Glucose, UA: NEGATIVE mg/dL
Hgb urine dipstick: NEGATIVE
Ketones, ur: NEGATIVE mg/dL
Leukocytes,Ua: NEGATIVE
Nitrite: NEGATIVE
Protein, ur: NEGATIVE mg/dL
Specific Gravity, Urine: 1.006 (ref 1.005–1.030)
pH: 7 (ref 5.0–8.0)

## 2023-01-15 LAB — COMPREHENSIVE METABOLIC PANEL
ALT: 17 U/L (ref 0–44)
AST: 18 U/L (ref 15–41)
Albumin: 4.4 g/dL (ref 3.5–5.0)
Alkaline Phosphatase: 48 U/L (ref 38–126)
Anion gap: 6 (ref 5–15)
BUN: 17 mg/dL (ref 6–20)
CO2: 24 mmol/L (ref 22–32)
Calcium: 8.8 mg/dL — ABNORMAL LOW (ref 8.9–10.3)
Chloride: 107 mmol/L (ref 98–111)
Creatinine, Ser: 0.81 mg/dL (ref 0.44–1.00)
GFR, Estimated: >60 mL/min (ref >60)
Glucose, Bld: 102 mg/dL — ABNORMAL HIGH (ref 70–99)
Potassium: 3.7 mmol/L (ref 3.5–5.1)
Sodium: 137 mmol/L (ref 135–145)
Total Bilirubin: 0.8 mg/dL (ref <1.2)
Total Protein: 7 g/dL (ref 6.5–8.1)

## 2023-01-15 LAB — LIPASE, BLOOD: Lipase: 49 U/L (ref 11–51)

## 2023-01-15 NOTE — Discharge Instructions (Signed)
Today you were seen for left-sided abdominal/back pain.  You may alternate taking Tylenol and Motrin as needed for pain.  Please do not take Motrin for greater than 5 days in a row as this may cause rebound headaches.  Thank you for letting us treat you today. After reviewing your labs and imaging, I feel you are safe to go home. Please follow up with your PCP in the next several days and provide them with your records from this visit. Return to the Emergency Room if pain becomes severe or symptoms worsen.

## 2023-01-15 NOTE — ED Triage Notes (Signed)
Patient arrives ambulatory by POV c/o lower left abdominal pain and cramping onset of 3am this morning. Patient states pain has been intermittent and also having pain to lower left side of back. Reports having a BM this morning that was normal. Took 2 ibuprofen about 7:30am that dulled the pain.

## 2023-01-15 NOTE — ED Notes (Signed)
Patient has urine culture in the main lab °

## 2023-01-15 NOTE — ED Provider Notes (Signed)
[facility] [facility]ERGENCY DEPARTMENT AT [facility] Provider Note   CSN: [id number] Arrival date & time: 01/15/23  0848     History  Chief Complaint  Patient presents with   Abdominal Pain    Kristy Benton is a 42 y.o. female past medical history of sinusitis and anxiety presents today for lower left abdominal pain.  Patient states she began having lower left abdominal cramping and pain at 3 AM this morning.  She states that it has been intermittent and is also having pain to the lower left side of her back.  She denies dysuria, hematuria, diarrhea, constipation or fever.  Patient states she took 2 ibuprofen at about 730 this morning which helped dull the pain some.  She does note some difficulty initiating urine stream.   Abdominal Pain      Home Medications Prior to Admission medications   Medication Sig Start Date End Date Taking? Authorizing Provider  albuterol (VENTOLIN HFA) 108 (90 Base) MCG/ACT inhaler Inhale into the lungs. 08/20/22   [provider]  ALPRAZolam Prudy Feeler) 0.5 MG tablet Take 1 tablet (0.5 mg total) by mouth daily as needed for anxiety. 12/14/22   Philip Aspen, Limmie Patricia, MD  Bacillus Coagulans-Inulin (PROBIOTIC) 1-250 BILLION-MG CAPS     [provider]  buPROPion (WELLBUTRIN XL) 150 MG 24 hr tablet TAKE 1 TABLET BY MOUTH DAILY 12/01/22   Philip Aspen, Limmie Patricia, MD  Carboxymethylcellulose Sod PF (REFRESH PLUS) 0.5 % SOLN 1 drop 2 (two) times a day. 10/22/15   [provider]  Cholecalciferol (VITAMIN D) 50 MCG (2000 UT) tablet Take 2,000 Units by mouth daily.    [provider]  desloratadine (CLARINEX) 5 MG tablet Take 5 mg by mouth daily.    [provider]  EPINEPHrine 0.3 mg/0.3 mL IJ SOAJ injection     [provider]  ferrous sulfate 325 (65 FE) MG tablet Take 325 mg by mouth every other day. Patient not taking: Reported on 12/14/2022    [provider]  fexofenadine (ALLEGRA)  180 MG tablet Take 180 mg by mouth daily.    [provider]  fluticasone (FLONASE) 50 MCG/ACT nasal spray Place 2 sprays into both nostrils daily.    [provider]  ipratropium (ATROVENT) 0.03 % nasal spray Place into both nostrils. Patient not taking: Reported on 12/14/2022 08/11/22   [provider]  LACTOBACILLUS PO Take by mouth. 04/21/20   [provider]  montelukast (SINGULAIR) 10 MG tablet Take 10 mg by mouth at bedtime.    [provider]  Probiotic Product (PROBIOTIC-10 PO) Take by mouth.    [provider]      Allergies    Amoxicillin    Review of Systems   Review of Systems  Gastrointestinal:  Positive for abdominal pain.  Genitourinary:  Positive for difficulty urinating and flank pain.    Physical Exam Updated Vital Signs BP 103/66   Pulse 60   Temp 98.3 F (36.8 C) (Oral)   Resp 18   Ht 5\' 8"  (1.727 m)   Wt 67.6 kg   SpO2 94%   BMI 22.66 kg/m  Physical Exam Vitals and nursing note reviewed.  Constitutional:      General: She is not in acute distress.    Appearance: She is well-developed.  HENT:     Head: Normocephalic and atraumatic.  Eyes:     Conjunctiva/sclera: Conjunctivae normal.  Cardiovascular:     Rate and Rhythm: Normal rate  and regular rhythm.     Heart sounds: No murmur heard. Pulmonary:     Effort: Pulmonary effort is normal. No respiratory distress.     Breath sounds: Normal breath sounds.  Abdominal:     General: Bowel sounds are normal.     Palpations: Abdomen is soft.     Tenderness: There is abdominal tenderness in the suprapubic area. There is left CVA tenderness. There is no right CVA tenderness or guarding. Negative signs include Murphy's sign, Rovsing's sign and McBurney's sign.  Musculoskeletal:        General: No swelling.     Cervical back: Neck supple.  Skin:    General: Skin is warm and dry.     Capillary Refill: Capillary refill takes less than 2 seconds.   Neurological:     Mental Status: She is alert.  Psychiatric:        Mood and Affect: Mood normal.     ED Results / Procedures / Treatments   Labs (all labs ordered are listed, but only abnormal results are displayed) Labs Reviewed  COMPREHENSIVE METABOLIC PANEL - Abnormal; Notable for the following components:      Result Value   Glucose, Bld 102 (*)    Calcium 8.8 (*)    All other components within normal limits  URINALYSIS, ROUTINE W REFLEX MICROSCOPIC - Abnormal; Notable for the following components:   Color, Urine STRAW (*)    All other components within normal limits  LIPASE, BLOOD  CBC    EKG None  Radiology CT Renal Stone Study  Result Date: 01/15/2023 CLINICAL DATA:  Abdominal/flank pain, stone suspected EXAM: CT ABDOMEN AND PELVIS WITHOUT CONTRAST TECHNIQUE: Multidetector CT imaging of the abdomen and pelvis was performed following the standard protocol without IV contrast. RADIATION DOSE REDUCTION: This exam was performed according to the departmental dose-optimization program which includes automated exposure control, adjustment of the mA and/or kV according to patient size and/or use of iterative reconstruction technique. COMPARISON:  None Available. FINDINGS: Lower chest: No acute abnormality. Hepatobiliary: No focal liver abnormality is seen. No gallstones, gallbladder wall thickening, or biliary dilatation. Pancreas: Unremarkable. No pancreatic ductal dilatation or surrounding inflammatory changes. Spleen: Normal in size without focal abnormality. Adrenals/Urinary Tract: Adrenal glands are unremarkable. Kidneys are normal, without renal calculi, focal lesion, or hydronephrosis. Bladder is unremarkable. Stomach/Bowel: Stomach is within normal limits. Appendix appears normal. No evidence of bowel wall thickening, distention, or inflammatory changes. Vascular/Lymphatic: No significant vascular findings are present. No enlarged abdominal or pelvic lymph nodes. Reproductive:  2.7 x 2.4 cm right ovarian cystic lesion. Other: No abdominal wall hernia or abnormality. No abdominopelvic ascites. Musculoskeletal: No acute or significant osseous findings. IMPRESSION: 1. No acute localizing findings in the abdomen or pelvis. 2. 2.7 cm right ovarian cystic lesion may represent a functional cyst. No specific follow-up imaging is recommended. (Reference: JACR 2020 Feb;17(2):248-254). Electronically Signed   By: Hart Robinsons M.D.   On: 01/15/2023 13:52    Procedures Procedures    Medications Ordered in ED Medications - No data to display  ED Course/ Medical Decision Making/ A&P                                 Medical Decision Making Amount and/or Complexity of Data Reviewed Labs: ordered. Radiology: ordered.   This patient presents to the ED with chief complaint(s) of suprapubic and left flank pain with pertinent past medical history of none  which further complicates the presenting complaint. The complaint involves an extensive differential diagnosis and also carries with it a high risk of complications and morbidity.    The differential diagnosis includes UTI, pyelonephritis, kidney stone  Additional history obtained: Records reviewed Primary Care Documents  ED Course and Reassessment:   Independent labs interpretation:  The following labs were independently interpreted:  CBC: No notable findings CMP: Mild hypocalcemia Lipase: 49 UA: No notable findings  Independent visualization of imaging: - I independently visualized the following imaging with scope of interpretation limited to determining acute life threatening conditions related to emergency care: Renal stone study, which revealed no acute localizing findings in the abdomen or pelvis.  2.7 cm right ovarian cystic lesion may represent a functional cyst.  Consultation: - Consulted or discussed management/test interpretation w/ external professional: None  Consideration for admission or further  workup: Considered for admission or further workup however patient's vital signs have been stable throughout her stay.  Patient's physical exam, labs, imaging have all been reassuring.  Patient should have outpatient follow-up with PCP if symptoms persist.        Final Clinical Impression(s) / ED Diagnoses Final diagnoses:  Suprapubic abdominal pain    Rx / DC Orders ED Discharge Orders     None         Dolphus Jenny, PA-C 01/15/23 1414    Jacalyn Lefevre, MD 01/15/23 1438

## 2023-01-18 ENCOUNTER — Ambulatory Visit (INDEPENDENT_AMBULATORY_CARE_PROVIDER_SITE_OTHER): Payer: BC Managed Care – PPO | Admitting: Internal Medicine

## 2023-01-18 VITALS — BP 110/70 | HR 65 | Temp 97.7°F | Wt 147.7 lb

## 2023-01-18 DIAGNOSIS — J3089 Other allergic rhinitis: Secondary | ICD-10-CM | POA: Diagnosis not present

## 2023-01-18 DIAGNOSIS — R1032 Left lower quadrant pain: Secondary | ICD-10-CM

## 2023-01-18 DIAGNOSIS — J3081 Allergic rhinitis due to animal (cat) (dog) hair and dander: Secondary | ICD-10-CM | POA: Diagnosis not present

## 2023-01-18 DIAGNOSIS — Z09 Encounter for follow-up examination after completed treatment for conditions other than malignant neoplasm: Secondary | ICD-10-CM

## 2023-01-18 DIAGNOSIS — J301 Allergic rhinitis due to pollen: Secondary | ICD-10-CM | POA: Diagnosis not present

## 2023-01-18 NOTE — Progress Notes (Signed)
Established Patient Office Visit     CC/Reason for Visit: ED follow up  HPI: Kristy Benton is a 42 y.o. female who is coming in today for the above mentioned reasons. Was in the ED 11/9 with acute, severe LLQ abdominal and flank pain. All workup was completely negative; she was thought to possibly have passed a kidney stone. CT abdomen was normal other than a right ovarian cyst. She is improved altho still has some dull LLQ pain.   Past Medical/Surgical History: Past Medical History:  Diagnosis Date   Depression    Uterine fibroid     Past Surgical History:  Procedure Laterality Date   MYOMECTOMY  11/03/2018    Social History:  reports that she has never smoked. She has never used smokeless tobacco. She reports current alcohol use. She reports that she does not use drugs.  Allergies: Allergies  Allergen Reactions   Amoxicillin Hives and Other (See Comments)    Family History:  Family History  Problem Relation Age of Onset   Coronary artery disease Paternal Grandfather      Current Outpatient Medications:    albuterol (VENTOLIN HFA) 108 (90 Base) MCG/ACT inhaler, Inhale into the lungs., Disp: , Rfl:    ALPRAZolam (XANAX) 0.5 MG tablet, Take 1 tablet (0.5 mg total) by mouth daily as needed for anxiety., Disp: 10 tablet, Rfl: 0   buPROPion (WELLBUTRIN XL) 150 MG 24 hr tablet, TAKE 1 TABLET BY MOUTH DAILY, Disp: 90 tablet, Rfl: 0   Carboxymethylcellulose Sod PF (REFRESH PLUS) 0.5 % SOLN, 1 drop 2 (two) times a day., Disp: , Rfl:    desloratadine (CLARINEX) 5 MG tablet, Take 5 mg by mouth daily., Disp: , Rfl:    EPINEPHrine 0.3 mg/0.3 mL IJ SOAJ injection, , Disp: , Rfl:    fluticasone (FLONASE) 50 MCG/ACT nasal spray, Place 2 sprays into both nostrils daily., Disp: , Rfl:    montelukast (SINGULAIR) 10 MG tablet, Take 10 mg by mouth at bedtime., Disp: , Rfl:    Probiotic Product (PROBIOTIC-10 PO), Take by mouth., Disp: , Rfl:   Review of Systems:  Negative  unless indicated in HPI.   Physical Exam: Vitals:   01/18/23 1032  BP: 110/70  Pulse: 65  Temp: 97.7 F (36.5 C)  TempSrc: Oral  SpO2: 99%  Weight: 147 lb 11.2 oz (67 kg)    Body mass index is 22.46 kg/m.   Physical Exam Vitals reviewed.  Constitutional:      Appearance: Normal appearance.  HENT:     Head: Normocephalic and atraumatic.  Eyes:     Conjunctiva/sclera: Conjunctivae normal.     Pupils: Pupils are equal, round, and reactive to light.  Skin:    General: Skin is warm and dry.  Neurological:     General: No focal deficit present.     Mental Status: She is alert and oriented to person, place, and time.  Psychiatric:        Mood and Affect: Mood normal.        Behavior: Behavior normal.        Thought Content: Thought content normal.        Judgment: Judgment normal.      Impression and Plan:  Hospital discharge follow-up  Left lower quadrant abdominal pain  -ED records reviewed in detail. Work up essentially negative. Pain is almost completely resolved. Nothing further at present.   Time spent:24 minutes reviewing chart, interviewing and examining patient and formulating plan of  care.     Chaya Jan, MD [facility] at [facility]

## 2023-01-26 DIAGNOSIS — J301 Allergic rhinitis due to pollen: Secondary | ICD-10-CM | POA: Diagnosis not present

## 2023-01-27 DIAGNOSIS — J3089 Other allergic rhinitis: Secondary | ICD-10-CM | POA: Diagnosis not present

## 2023-01-28 DIAGNOSIS — J3089 Other allergic rhinitis: Secondary | ICD-10-CM | POA: Diagnosis not present

## 2023-01-28 DIAGNOSIS — J3081 Allergic rhinitis due to animal (cat) (dog) hair and dander: Secondary | ICD-10-CM | POA: Diagnosis not present

## 2023-01-28 DIAGNOSIS — J301 Allergic rhinitis due to pollen: Secondary | ICD-10-CM | POA: Diagnosis not present

## 2023-02-08 DIAGNOSIS — M5114 Intervertebral disc disorders with radiculopathy, thoracic region: Secondary | ICD-10-CM | POA: Diagnosis not present

## 2023-02-08 DIAGNOSIS — N3289 Other specified disorders of bladder: Secondary | ICD-10-CM | POA: Diagnosis not present

## 2023-02-08 DIAGNOSIS — M5116 Intervertebral disc disorders with radiculopathy, lumbar region: Secondary | ICD-10-CM | POA: Diagnosis not present

## 2023-02-08 DIAGNOSIS — M545 Low back pain, unspecified: Secondary | ICD-10-CM | POA: Diagnosis not present

## 2023-02-08 DIAGNOSIS — R339 Retention of urine, unspecified: Secondary | ICD-10-CM | POA: Diagnosis not present

## 2023-02-08 DIAGNOSIS — R102 Pelvic and perineal pain: Secondary | ICD-10-CM | POA: Diagnosis not present

## 2023-02-10 DIAGNOSIS — M6289 Other specified disorders of muscle: Secondary | ICD-10-CM | POA: Diagnosis not present

## 2023-02-10 DIAGNOSIS — F419 Anxiety disorder, unspecified: Secondary | ICD-10-CM | POA: Diagnosis not present

## 2023-02-10 DIAGNOSIS — R339 Retention of urine, unspecified: Secondary | ICD-10-CM | POA: Diagnosis not present

## 2023-02-10 DIAGNOSIS — R3989 Other symptoms and signs involving the genitourinary system: Secondary | ICD-10-CM | POA: Diagnosis not present

## 2023-02-17 DIAGNOSIS — J301 Allergic rhinitis due to pollen: Secondary | ICD-10-CM | POA: Diagnosis not present

## 2023-02-17 DIAGNOSIS — J3089 Other allergic rhinitis: Secondary | ICD-10-CM | POA: Diagnosis not present

## 2023-02-17 DIAGNOSIS — J3081 Allergic rhinitis due to animal (cat) (dog) hair and dander: Secondary | ICD-10-CM | POA: Diagnosis not present

## 2023-02-21 DIAGNOSIS — N9489 Other specified conditions associated with female genital organs and menstrual cycle: Secondary | ICD-10-CM | POA: Diagnosis not present

## 2023-02-21 NOTE — Therapy (Signed)
OUTPATIENT PHYSICAL THERAPY FEMALE PELVIC EVALUATION   Patient Name: Kristy Benton MRN: [id number] DOB:1981/03/04, 42 y.o., female Today's Date: 02/22/2023  END OF SESSION:  PT End of Session - 02/22/23 1630     Visit Number 1    Authorization Type bcbs [id number]  no auth req    PT Start Time 1615    PT Stop Time 1700    PT Time Calculation (min) 45 min    Activity Tolerance Patient tolerated treatment well    Behavior During Therapy Anxious             Past Medical History:  Diagnosis Date   Depression    Uterine fibroid    Past Surgical History:  Procedure Laterality Date   MYOMECTOMY  11/03/2018   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   Vitamin B12 deficiency 11/30/2021   Allergic rhinitis 10/12/2021   Allergic rhinitis due to animal hair and dander 10/12/2021   Allergic rhinitis due to pollen 10/12/2021   Chronic allergic conjunctivitis 10/12/2021   Pain in finger of right hand 04/21/2020   Abnormal uterine bleeding (AUB) 10/19/2018   Double vision with both eyes open 05/19/2018   Recurrent sinusitis 11/02/2016   Allergy 11/02/2016    PCP: Philip Aspen, Limmie Patricia, MD  REFERRING PROVIDER: Jerrell Mylar, MD  REFERRING DIAG: M62.89 (ICD-10-CM) - High-tone pelvic floor dysfunction   THERAPY DIAG:  Other muscle spasm  Rationale for Evaluation and Treatment: Rehabilitation  ONSET DATE: Nov 30th 2024  SUBJECTIVE:                                                                                                                                                                                           SUBJECTIVE STATEMENT: Pt reports that she had  trouble emptying her bladder on Nov 30th, went to ER, got catheter , they emptied 1.5 liters. She had a catheter in until yesterday when she saw a nurse. Got a  referral for pelvic PT and was able to come today.  They wanted to send her home with a catheter, she broke down and has been trying to stretch and whatever she can to do not to have to have it again. She is nervous about not being able to pee now, felt like she was clenching the catheter for 2 weeks, it was awful. She is not sure why she could not empty her bladder but  has been working with a trainer, has been doing a lot of hip openers.  Drove to see family in [place] 8.5 hrs, got tight during the trip   She reports that she has always had super tight hips. She is not sure.  Early November she had symptoms of passing a kidney stone, they never found a stone. Had a CT scan. Is wondering if that could contribute to bladder issue.  Reports that she used to be in a bad marriage, gets really nervous with pelvic exams, dating a nice guy now, intercourse is not painful.  Has a daughter , 1 years old. Goes to the gym 5 times/ week, runs, this is her therapy. Also seeing a counselor and at times gets massages. Both help with stress at work.  Is wondering if she can have sex now.  Fluid intake: Yes: water a lot    PAIN:  Are you having pain? No, just a little on left hip, was much worse NPRS scale: 3/10 Pain location: left hip, feels like a pull, reports that she is sure pain is related  Pain type: tight Pain description: intermittent, getting better  Aggravating factors: not sure Relieving factors: moving  PRECAUTIONS: None  RED FLAGS: None   WEIGHT BEARING RESTRICTIONS: No  FALLS:  Has patient fallen in last 6 months? No  LIVING ENVIRONMENT: Lives with: lives with their daughter Lives in: House/apartment Stairs: No Has following equipment at home: None  OCCUPATION: [employer] benefits  PLOF: Independent  PATIENT GOALS: to maintain normalcy with urination  PERTINENT HISTORY:  Dec 2020 partial hysterectomy d/t fibroids Sexual abuse: No- but sex was not pleasant during her bad  marriage  BOWEL MOVEMENT: NA  URINATION: Pain with urination: No Fully empty bladder: Yes:   Stream: Strong Urgency: No Frequency: every hour Leakage:  no Pads: No  INTERCOURSE: Pain with intercourse:  no Ability to have vaginal penetration:  No Climax: yes   PREGNANCY: Vaginal deliveries 1 Tearing yes C-section deliveries 0 Currently pregnant No  PROLAPSE: None   OBJECTIVE:  Note: Objective measures were completed at Evaluation unless otherwise noted.  COGNITION: Overall cognitive status: Within functional limits for tasks assessed     SENSATION: Light touch: Appears intact Proprioception: Appears intact  MUSCLE LENGTH: Hamstrings: Right 80 deg; Left 80 deg   LUMBAR SPECIAL TESTS:  Straight leg raise test: Negative  GAIT: no issues   POSTURE: rounded shoulders, forward head, and increased lumbar lordosis  PELVIC ALIGNMENT: seems even  LUMBARAROM/PROM:  A/PROM A/PROM  eval  Flexion Within functional limitations   Extension   Right lateral flexion   Left lateral flexion   Right rotation   Left rotation    (Blank rows = not tested)  LOWER EXTREMITY ROM:  Passive ROM Right eval Left eval  Hip flexion Within functional limitations  Within functional limitations   Hip extension    Hip abduction    Hip adduction    Hip internal rotation 65 65  Hip external rotation 80 80  Knee flexion    Knee extension    Ankle dorsiflexion    Ankle plantarflexion    Ankle inversion    Ankle eversion     (Blank rows = not tested)  LOWER EXTREMITY MMT: 5/5 grossly overall  PALPATION:   General -  restrictions throughout abdomen, upper chest breathing                External Perineal Exam - tight and tender, elevated  Internal Pelvic Floor-  tight and tender, pt with tremor with anxiety, offered to postpone pelvic exam, pt insisted to perform today  Patient confirms identification and approves PT to assess internal pelvic  floor and treatment Yes  PELVIC MMT:   MMT eval  Vaginal 3/5  Internal Anal Sphincter   External Anal Sphincter   Puborectalis   Diastasis Recti no  (Blank rows = not tested)        TONE: high  PROLAPSE: None noticed in hooklying  TODAY'S TREATMENT:                                                                                                                              DATE: 02/22/2023  EVAL see below   PATIENT EDUCATION:  Education details: relevant anatomy, HEP, exam findings, diaphragmatic breathing reed, physical therapy recommendations for downtraining  Person educated: Patient Education method: Explanation, Demonstration, Tactile cues, Verbal cues, and Handouts Education comprehension: verbalized understanding and needs further education  HOME EXERCISE PROGRAM: [id number]  ASSESSMENT:  CLINICAL IMPRESSION: Patient is a 42 y.o. F who was seen today for physical therapy evaluation and treatment for high tone pelvic floor dysfunction. She had increased anxiety with pelvic exam and does have increased muscle tone throughout. She will benefit from addressing this tension with HEP focusing on down training, diaphragmatic breathing and using her dilator to lengthen PF muscles and surrounding soft tissue. Lubricant samples given. Discussed returning to the gym as pt can for stress relief, intercourse ( she inquired), stress reduction with massage, diaphragmatic breathing during the day and progressive relaxation. Pt will benefit from PT to improve pelvic floor lenghthening and prevent urinary hesitancy and muscle tightness.  OBJECTIVE IMPAIRMENTS: decreased coordination, decreased ROM, increased fascial restrictions, increased muscle spasms, impaired tone, and pain.   ACTIVITY LIMITATIONS: toileting  PARTICIPATION LIMITATIONS: interpersonal relationship and community activity  PERSONAL FACTORS: Behavior pattern are also affecting patient's functional outcome.   REHAB  POTENTIAL: Good  CLINICAL DECISION MAKING: Stable/uncomplicated  EVALUATION COMPLEXITY: Low   GOALS: Goals reviewed with patient? Yes  SHORT TERM GOALS: Target date: 03/22/2023    Pt will be I with her HEP Baseline: Goal status: INITIAL  2.  Pt will be I with use of vibrator/ dilator to reduce pelvic floor tension  Baseline:  Goal status: INITIAL  3.  Pt will demonstrate improved bladder emptying to at least every 2 hours Baseline:  Goal status: INITIAL  4.  Pt will report reduced left hip to max 3/10 with walking for 30 mins Baseline:  Goal status: INITIAL   LONG TERM GOALS: Target date: 05/17/2023    Pt will demonstrate good AROM pelvic floor and ability to contract, relax and bulge Baseline:  Goal status: INITIAL  2.  Pt will be I with advanced HEP Baseline:  Goal status: INITIAL  3.  Pt will report normal bladder emptying and strong urine stream 100% of time. Baseline:  Goal status: INITIAL  4.  Pt will report max 2/10 left hip pain with sitting for 2 hrs Baseline:  Goal status: INITIAL   PLAN:  PT FREQUENCY: 1-2x/week  PT DURATION:  8 sessions  PLANNED INTERVENTIONS: 97110-Therapeutic exercises, 97530- Therapeutic activity, 97112- Neuromuscular re-education, 97535- Self Care, [id number]- Manual therapy, Dry Needling, Joint mobilization, Joint manipulation, Spinal manipulation, Spinal mobilization, Scar mobilization, and Biofeedback  PLAN FOR NEXT SESSION: continue down training, trial of dry needling   Ellanie Oppedisano, PT 02/22/23 7:24 PM

## 2023-02-22 ENCOUNTER — Encounter: Payer: Self-pay | Admitting: Physical Therapy

## 2023-02-22 ENCOUNTER — Other Ambulatory Visit: Payer: Self-pay

## 2023-02-22 ENCOUNTER — Ambulatory Visit: Payer: BC Managed Care – PPO | Attending: Obstetrics and Gynecology | Admitting: Physical Therapy

## 2023-02-22 DIAGNOSIS — M62838 Other muscle spasm: Secondary | ICD-10-CM | POA: Insufficient documentation

## 2023-02-27 ENCOUNTER — Other Ambulatory Visit: Payer: Self-pay | Admitting: Internal Medicine

## 2023-02-28 ENCOUNTER — Other Ambulatory Visit: Payer: Self-pay | Admitting: Internal Medicine

## 2023-02-28 DIAGNOSIS — F419 Anxiety disorder, unspecified: Secondary | ICD-10-CM

## 2023-03-03 DIAGNOSIS — N398 Other specified disorders of urinary system: Secondary | ICD-10-CM | POA: Diagnosis not present

## 2023-03-04 DIAGNOSIS — J3089 Other allergic rhinitis: Secondary | ICD-10-CM | POA: Diagnosis not present

## 2023-03-04 DIAGNOSIS — J3081 Allergic rhinitis due to animal (cat) (dog) hair and dander: Secondary | ICD-10-CM | POA: Diagnosis not present

## 2023-03-04 DIAGNOSIS — J301 Allergic rhinitis due to pollen: Secondary | ICD-10-CM | POA: Diagnosis not present

## 2023-03-08 ENCOUNTER — Ambulatory Visit: Payer: BC Managed Care – PPO | Admitting: Physical Therapy

## 2023-03-08 ENCOUNTER — Encounter: Payer: Self-pay | Admitting: Physical Therapy

## 2023-03-08 DIAGNOSIS — M62838 Other muscle spasm: Secondary | ICD-10-CM

## 2023-03-08 NOTE — Therapy (Signed)
 OUTPATIENT PHYSICAL THERAPY FEMALE PELVIC TREATMENT   Patient Name: Kristy Benton MRN: [id number] DOB:1980/07/07, 42 y.o., female Today's Date: 03/08/2023  END OF SESSION:  PT End of Session - 03/08/23 1030     Visit Number 2    Authorization Type bcbs [id number]  no auth req    PT Start Time 1017    PT Stop Time 1100    PT Time Calculation (min) 43 min    Activity Tolerance Patient tolerated treatment well    Behavior During Therapy [facility] for tasks assessed/performed              Past Medical History:  Diagnosis Date   Depression    Uterine fibroid    Past Surgical History:  Procedure Laterality Date   MYOMECTOMY  11/03/2018   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   Vitamin B12 deficiency 11/30/2021   Allergic rhinitis 10/12/2021   Allergic rhinitis due to animal hair and dander 10/12/2021   Allergic rhinitis due to pollen 10/12/2021   Chronic allergic conjunctivitis 10/12/2021   Pain in finger of right hand 04/21/2020   Abnormal uterine bleeding (AUB) 10/19/2018   Double vision with both eyes open 05/19/2018   Recurrent sinusitis 11/02/2016   Allergy 11/02/2016    PCP: Theophilus Andrews, Tully GRADE, MD  REFERRING PROVIDER: Alvia Dorothyann LABOR, MD  REFERRING DIAG: M62.89 (ICD-10-CM) - High-tone pelvic floor dysfunction   THERAPY DIAG:  Other muscle spasm  Rationale for Evaluation and Treatment: Rehabilitation  ONSET DATE: Nov 30th 2024  SUBJECTIVE:                                                                                                                                                                                           SUBJECTIVE STATEMENT: Pt reports that she is overall doing well.  Had one stressful situation when she had cramping in her legs.  Was able to fully able to empty bladder at Dr. Alvia  office, so that was good.  She wants to make sure that she is doing stretches right Has gone back to the gym. Pelvic pain is gone Takes anti anxiety meds every day, Xanax , takes meds for panic attacks.  Urination feels ok.  Has massages, acupuncture When she has a panic attack, she feels like she can't breathe Intercourse is not painful     Fluid intake: Yes: water a lot    PAIN:  Are you having pain? No, just a little on left hip, was much worse NPRS  scale: 3/10 Pain location: left hip, feels like a pull, reports that she is sure pain is related  Pain type: tight Pain description: intermittent, getting better  Aggravating factors: not sure Relieving factors: moving  PRECAUTIONS: None  RED FLAGS: None   WEIGHT BEARING RESTRICTIONS: No  FALLS:  Has patient fallen in last 6 months? No  LIVING ENVIRONMENT: Lives with: lives with their daughter Lives in: House/apartment Stairs: No Has following equipment at home: None  OCCUPATION: [employer] benefits  PLOF: Independent  PATIENT GOALS: to maintain normalcy with urination  PERTINENT HISTORY:  Dec 2020 partial hysterectomy d/t fibroids Sexual abuse: No- but sex was not pleasant during her bad marriage  BOWEL MOVEMENT: NA  URINATION: Pain with urination: No Fully empty bladder: Yes:   Stream: Strong Urgency: No Frequency: every hour Leakage:  no Pads: No  INTERCOURSE: Pain with intercourse:  no Ability to have vaginal penetration:  No Climax: yes   PREGNANCY: Vaginal deliveries 1 Tearing yes C-section deliveries 0 Currently pregnant No  PROLAPSE: None   OBJECTIVE:  Note: Objective measures were completed at Evaluation unless otherwise noted.  COGNITION: Overall cognitive status: Within functional limits for tasks assessed     SENSATION: Light touch: Appears intact Proprioception: Appears intact  MUSCLE LENGTH: Hamstrings: Right 80 deg; Left 80 deg   LUMBAR SPECIAL TESTS:  Straight leg raise  test: Negative  GAIT: no issues   POSTURE: rounded shoulders, forward head, and increased lumbar lordosis  PELVIC ALIGNMENT: seems even  LUMBARAROM/PROM:  A/PROM A/PROM  eval  Flexion Within functional limitations   Extension   Right lateral flexion   Left lateral flexion   Right rotation   Left rotation    (Blank rows = not tested)  LOWER EXTREMITY ROM:  Passive ROM Right eval Left eval  Hip flexion Within functional limitations  Within functional limitations   Hip extension    Hip abduction    Hip adduction    Hip internal rotation 65 65  Hip external rotation 80 80  Knee flexion    Knee extension    Ankle dorsiflexion    Ankle plantarflexion    Ankle inversion    Ankle eversion     (Blank rows = not tested)  LOWER EXTREMITY MMT: 5/5 grossly overall  PALPATION:   General -  restrictions throughout abdomen, upper chest breathing                External Perineal Exam - tight and tender, elevated                              Internal Pelvic Floor-  tight and tender, pt with tremor with anxiety, offered to postpone pelvic exam, pt insisted to perform today  Patient confirms identification and approves PT to assess internal pelvic floor and treatment Yes  PELVIC MMT:   MMT eval  Vaginal 3/5  Internal Anal Sphincter   External Anal Sphincter   Puborectalis   Diastasis Recti no  (Blank rows = not tested)        TONE: high  PROLAPSE: None noticed in hooklying  TODAY'S TREATMENT:  DATE: 03/08/2023    Neuro reed- child's pose Progressive relaxation by Elveria Locust Piriformis stretch with lumbar rotation Happy baby on a wall    EVAL see below   PATIENT EDUCATION:  Education details: relevant anatomy, HEP, exam findings, diaphragmatic breathing reed, physical therapy recommendations for downtraining  Person educated:  Patient Education method: Explanation, Demonstration, Tactile cues, Verbal cues, and Handouts Education comprehension: verbalized understanding and needs further education  HOME EXERCISE PROGRAM: [id number]  ASSESSMENT:  CLINICAL IMPRESSION: Pt progressing well, tried progressive relaxation by Elveria Rey Blackwood and added new stretches Pt will benefit from cont PT.      OBJECTIVE IMPAIRMENTS: decreased coordination, decreased ROM, increased fascial restrictions, increased muscle spasms, impaired tone, and pain.   ACTIVITY LIMITATIONS: toileting  PARTICIPATION LIMITATIONS: interpersonal relationship and community activity  PERSONAL FACTORS: Behavior pattern are also affecting patient's functional outcome.   REHAB POTENTIAL: Good  CLINICAL DECISION MAKING: Stable/uncomplicated  EVALUATION COMPLEXITY: Low   GOALS: Goals reviewed with patient? Yes  SHORT TERM GOALS: Target date: 03/22/2023    Pt will be I with her HEP Baseline: Goal status: INITIAL  2.  Pt will be I with use of vibrator/ dilator to reduce pelvic floor tension  Baseline:  Goal status: INITIAL  3.  Pt will demonstrate improved bladder emptying to at least every 2 hours Baseline:  Goal status: INITIAL  4.  Pt will report reduced left hip to max 3/10 with walking for 30 mins Baseline:  Goal status: INITIAL   LONG TERM GOALS: Target date: 05/17/2023    Pt will demonstrate good AROM pelvic floor and ability to contract, relax and bulge Baseline:  Goal status: INITIAL  2.  Pt will be I with advanced HEP Baseline:  Goal status: INITIAL  3.  Pt will report normal bladder emptying and strong urine stream 100% of time. Baseline:  Goal status: INITIAL  4.  Pt will report max 2/10 left hip pain with sitting for 2 hrs Baseline:  Goal status: INITIAL   PLAN:  PT FREQUENCY: 1-2x/week  PT DURATION:  8 sessions  PLANNED INTERVENTIONS: 97110-Therapeutic exercises, 97530- Therapeutic activity,  97112- Neuromuscular re-education, 97535- Self Care, [id number]- Manual therapy, Dry Needling, Joint mobilization, Joint manipulation, Spinal manipulation, Spinal mobilization, Scar mobilization, and Biofeedback  PLAN FOR NEXT SESSION: continue down training, trial of dry needling   Ramona Slinger, PT 03/08/23 11:03 AM

## 2023-03-14 DIAGNOSIS — N898 Other specified noninflammatory disorders of vagina: Secondary | ICD-10-CM | POA: Diagnosis not present

## 2023-03-14 DIAGNOSIS — L292 Pruritus vulvae: Secondary | ICD-10-CM | POA: Diagnosis not present

## 2023-03-15 ENCOUNTER — Encounter: Payer: Self-pay | Admitting: Physical Therapy

## 2023-03-18 ENCOUNTER — Encounter: Payer: Self-pay | Admitting: Physical Therapy

## 2023-03-18 ENCOUNTER — Ambulatory Visit: Payer: BC Managed Care – PPO | Attending: Obstetrics and Gynecology | Admitting: Physical Therapy

## 2023-03-18 DIAGNOSIS — M62838 Other muscle spasm: Secondary | ICD-10-CM | POA: Diagnosis not present

## 2023-03-18 DIAGNOSIS — J301 Allergic rhinitis due to pollen: Secondary | ICD-10-CM | POA: Diagnosis not present

## 2023-03-18 DIAGNOSIS — J3089 Other allergic rhinitis: Secondary | ICD-10-CM | POA: Diagnosis not present

## 2023-03-18 NOTE — Therapy (Signed)
 OUTPATIENT PHYSICAL THERAPY FEMALE PELVIC TREATMENT   Patient Name: Kristy Benton MRN: [id number] DOB:04-23-1980, 43 y.o., female Today's Date: 03/18/2023  END OF SESSION:  PT End of Session - 03/18/23 0804     Visit Number 3    Authorization Type [id number]  no auth req    PT Start Time 0800    PT Stop Time 0840    PT Time Calculation (min) 40 min    Activity Tolerance Patient tolerated treatment well    Behavior During Therapy [facility] for tasks assessed/performed               Past Medical History:  Diagnosis Date   Depression    Uterine fibroid    Past Surgical History:  Procedure Laterality Date   MYOMECTOMY  11/03/2018   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   Vitamin B12 deficiency 11/30/2021   Allergic rhinitis 10/12/2021   Allergic rhinitis due to animal hair and dander 10/12/2021   Allergic rhinitis due to pollen 10/12/2021   Chronic allergic conjunctivitis 10/12/2021   Pain in finger of right hand 04/21/2020   Abnormal uterine bleeding (AUB) 10/19/2018   Double vision with both eyes open 05/19/2018   Recurrent sinusitis 11/02/2016   Allergy 11/02/2016    PCP: Theophilus Andrews, Tully GRADE, MD  REFERRING PROVIDER: Alvia Dorothyann LABOR, MD  REFERRING DIAG: 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 Has some gripping in her glutes.  Some low back pain. Sex is not painful Urination is normal Went to counseling on Monday Has  some stress at work Doing stretches, going to the gym  Fluid intake: Yes: water a lot    PAIN:  Are you having pain? No, just a little on left hip, was much worse NPRS scale: 3/10 Pain location: left hip, feels like a pull, reports that she is sure pain is related  Pain type: tight Pain description: intermittent, getting better  Aggravating factors: not sure Relieving factors: moving  PRECAUTIONS: None  RED FLAGS: None   WEIGHT BEARING RESTRICTIONS: No  FALLS:  Has patient fallen in last  6 months? No  LIVING ENVIRONMENT: Lives with: lives with their daughter Lives in: House/apartment Stairs: No Has following equipment at home: None  OCCUPATION: [employer] benefits  PLOF: Independent  PATIENT GOALS: to maintain normalcy with urination  PERTINENT HISTORY:  Dec 2020 partial hysterectomy d/t fibroids Sexual abuse: No- but sex was not pleasant during her bad marriage  BOWEL MOVEMENT: NA  URINATION: Pain with urination: No Fully empty bladder: Yes:   Stream: Strong Urgency: No Frequency: every hour Leakage:  no Pads: No  INTERCOURSE: Pain with intercourse:  no Ability to have vaginal penetration:  No Climax: yes   PREGNANCY: Vaginal deliveries 1 Tearing yes C-section deliveries 0 Currently pregnant No  PROLAPSE: None   OBJECTIVE:  Note: Objective measures were completed at Evaluation unless otherwise noted.  COGNITION: Overall cognitive status: Within functional limits for tasks assessed     SENSATION: Light touch: Appears intact Proprioception: Appears intact  MUSCLE LENGTH: Hamstrings: Right 80 deg; Left 80 deg   LUMBAR SPECIAL TESTS:  Straight leg raise test: Negative  GAIT: no issues   POSTURE: rounded shoulders, forward head, and increased lumbar lordosis  PELVIC ALIGNMENT: seems even  LUMBARAROM/PROM:  A/PROM A/PROM  eval  Flexion Within functional limitations   Extension   Right lateral flexion   Left lateral flexion    Right rotation   Left rotation    (Blank rows = not tested)  LOWER EXTREMITY ROM:  Passive ROM Right eval Left eval  Hip flexion Within functional limitations  Within functional limitations   Hip extension    Hip abduction    Hip adduction    Hip internal rotation 65 65  Hip external rotation 80 80  Knee flexion    Knee extension    Ankle dorsiflexion    Ankle plantarflexion    Ankle inversion    Ankle eversion     (Blank rows = not tested)  LOWER EXTREMITY MMT: 5/5 grossly overall  PALPATION:   General -  restrictions throughout abdomen, upper chest breathing                External Perineal Exam - tight and tender, elevated                              Internal Pelvic Floor-  tight and tender, pt with tremor with anxiety, offered to postpone pelvic exam, pt insisted to perform today  Patient confirms identification and approves PT to assess internal pelvic floor and treatment Yes  PELVIC MMT:   MMT eval  Vaginal 3/5  Internal Anal Sphincter   External Anal Sphincter   Puborectalis   Diastasis Recti no  (Blank rows = not tested)        TONE: high  PROLAPSE: None noticed in hooklying  TODAY'S TREATMENT:  DATE: 03/18/23   Manual- trial of dry needling Neuro reed- diaphragmatic breathing Quadratus lumborum stretch Therapeutic activities- education on lubricants and dilators/ wands   PATIENT EDUCATION:  Education details: relevant anatomy, HEP, exam findings, diaphragmatic breathing reed, physical therapy recommendations for downtraining  Person educated: Patient Education method: Explanation, Demonstration, Tactile cues, Verbal cues, and Handouts Education comprehension: verbalized understanding and needs further education  HOME EXERCISE PROGRAM: [id number]  Treatment instructions: First Select Initial or Subsequent and then  (Multi-select), will start with all checked Initial Treatment: Pt instructed on dry needling rationale, procedures, and possible side effects. Pt instructed to expect mild to moderate muscle soreness later today and/or tomorrow. Pt instructed in methods to reduce muscle soreness. Pt instructed to continue prescribed HEP. Because dry needling was performed over or adjacent to a lung field, pt was educated on S/S of pneumothorax and to seek immediate medical attention should they occur.  Patient was educated on signs and symptoms of infection and other risk factors and advised to seek medical attention should they occur. Patient verbalized understanding of these instructions and education. Subsequent Treatment Instructions provided previously at initial dry needling treatment Instructions reviewed, if requested by the patient, prior to subsequent dry needling treatment  Patient Verbal Consent Given: Yes  Education handout provided: Yes (Yes/No/Previously Provided) Muscles treated: lumbar paraspinals and glutes Electrical stimulation performed: No (Yes/No) Parameters: N/A ((Will change to only populate if yes is selected) Treatment response/outcome: reduced pain    ASSESSMENT:  CLINICAL IMPRESSION: Pt progressing well, tried dry needling- see above Pt will benefit from cont PT.      OBJECTIVE IMPAIRMENTS: decreased coordination, decreased ROM, increased fascial restrictions, increased muscle spasms, impaired tone, and pain.   ACTIVITY LIMITATIONS: toileting  PARTICIPATION LIMITATIONS: interpersonal relationship and community activity  PERSONAL FACTORS: Behavior pattern are also affecting patient's functional outcome.   REHAB POTENTIAL: Good  CLINICAL DECISION MAKING: Stable/uncomplicated  EVALUATION COMPLEXITY: Low   GOALS: Goals reviewed with patient? Yes  SHORT TERM GOALS: Target date: 03/22/2023    Pt will be I with her HEP Baseline: Goal status: INITIAL  2.  Pt  will be I with use of vibrator/ dilator to reduce pelvic floor tension  Baseline:  Goal status: INITIAL  3.  Pt will demonstrate improved bladder emptying to at least every 2 hours Baseline:  Goal status: INITIAL  4.  Pt will report reduced left hip to max 3/10 with walking for 30 mins Baseline:  Goal status: INITIAL   LONG TERM GOALS: Target date: 05/17/2023    Pt will demonstrate good AROM pelvic floor and ability to contract, relax and bulge Baseline:  Goal status: INITIAL  2.  Pt will be I with advanced HEP Baseline:  Goal status: INITIAL  3.  Pt will report normal bladder emptying and strong urine stream 100% of time. Baseline:  Goal status: INITIAL  4.  Pt will report max 2/10 left hip pain with sitting for 2 hrs Baseline:  Goal status: INITIAL   PLAN:  PT FREQUENCY: 1-2x/week  PT DURATION:  8 sessions  PLANNED INTERVENTIONS: 97110-Therapeutic exercises, 97530- Therapeutic activity, 97112- Neuromuscular re-education, 97535- Self Care, [id number]- Manual therapy, Dry Needling, Joint mobilization, Joint manipulation, Spinal manipulation, Spinal mobilization, Scar mobilization, and Biofeedback  PLAN FOR NEXT SESSION: continue down training, trial of dry needling   Latanja Lehenbauer, PT 03/18/23 8:37 AM

## 2023-03-21 ENCOUNTER — Ambulatory Visit: Payer: BC Managed Care – PPO | Admitting: Obstetrics and Gynecology

## 2023-03-23 DIAGNOSIS — J301 Allergic rhinitis due to pollen: Secondary | ICD-10-CM | POA: Diagnosis not present

## 2023-03-23 DIAGNOSIS — J3081 Allergic rhinitis due to animal (cat) (dog) hair and dander: Secondary | ICD-10-CM | POA: Diagnosis not present

## 2023-03-23 DIAGNOSIS — J3089 Other allergic rhinitis: Secondary | ICD-10-CM | POA: Diagnosis not present

## 2023-03-25 ENCOUNTER — Encounter: Payer: Self-pay | Admitting: Physical Therapy

## 2023-03-28 ENCOUNTER — Ambulatory Visit: Payer: BC Managed Care – PPO | Admitting: Physical Therapy

## 2023-03-28 ENCOUNTER — Encounter: Payer: Self-pay | Admitting: Physical Therapy

## 2023-03-28 DIAGNOSIS — M62838 Other muscle spasm: Secondary | ICD-10-CM

## 2023-03-28 NOTE — Therapy (Signed)
OUTPATIENT PHYSICAL THERAPY FEMALE PELVIC TREATMENT   Patient Name: Kristy Benton MRN: [id number] DOB:April 03, 1980, 43 y.o., female Today's Date: 03/28/2023  END OF SESSION:  PT End of Session - 03/28/23 1009     Visit Number 4    Authorization Type bcbs [id number]  no auth req    PT Start Time 0930    PT Stop Time 1015    PT Time Calculation (min) 45 min    Activity Tolerance Patient tolerated treatment well    Behavior During Therapy [facility] for tasks assessed/performed                Past Medical History:  Diagnosis Date   Depression    Uterine fibroid    Past Surgical History:  Procedure Laterality Date   MYOMECTOMY  11/03/2018   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   Vitamin B12 deficiency 11/30/2021   Allergic rhinitis 10/12/2021   Allergic rhinitis due to animal hair and dander 10/12/2021   Allergic rhinitis due to pollen 10/12/2021   Chronic allergic conjunctivitis 10/12/2021   Pain in finger of right hand 04/21/2020   Abnormal uterine bleeding (AUB) 10/19/2018   Double vision with both eyes open 05/19/2018   Recurrent sinusitis 11/02/2016   Allergy 11/02/2016    PCP: Philip Aspen, Limmie Patricia, MD  REFERRING PROVIDER: Jerrell Mylar, MD  REFERRING DIAG: 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 Maybe she is in her head too, she is a little worried, Her hip hurts with  sitting. She did a lot of meetings last week, had increased hip pain.  When she sits more, hesitancy is worse.  Tried to run in TM. Was hard on the hip.  Intercourse is OK.  Her TMJ is tight too.  Drama in her friend group is affecting her  Fluid intake: Yes: water a lot    PAIN:  Are you having pain? No, just a little on left hip, was much worse NPRS scale: 3/10 Pain location: left hip, feels like a pull, reports that she  is sure pain is related  Pain type: tight Pain description: intermittent, getting better  Aggravating factors: not sure Relieving factors: moving  PRECAUTIONS: None  RED FLAGS: None   WEIGHT BEARING RESTRICTIONS: No  FALLS:  Has patient fallen in last 6 months? No  LIVING ENVIRONMENT: Lives with: lives with their daughter Lives in: House/apartment Stairs: No Has following equipment at home: None  OCCUPATION: [employer] benefits  PLOF: Independent  PATIENT GOALS: to maintain normalcy with urination  PERTINENT HISTORY:  Dec 2020 partial hysterectomy d/t fibroids Sexual abuse: No- but sex was not pleasant during her bad marriage  BOWEL MOVEMENT: NA  URINATION: Pain with urination: No Fully empty bladder: Yes:   Stream: Strong Urgency: No Frequency: every hour Leakage:  no Pads: No  INTERCOURSE: Pain with intercourse:  no Ability to have vaginal penetration:  No Climax: yes   PREGNANCY: Vaginal deliveries 1 Tearing yes C-section deliveries 0 Currently pregnant No  PROLAPSE: None   OBJECTIVE:  Note: Objective measures were completed at Evaluation unless otherwise noted.  COGNITION: Overall cognitive status: Within functional limits for tasks assessed     SENSATION: Light touch: Appears intact Proprioception: Appears intact  MUSCLE LENGTH: Hamstrings: Right 80 deg; Left 80 deg   LUMBAR SPECIAL TESTS:  Straight leg raise test: Negative  GAIT: no issues   POSTURE: rounded shoulders, forward head, and increased lumbar  lordosis  PELVIC ALIGNMENT: seems even  LUMBARAROM/PROM:  A/PROM A/PROM  eval  Flexion Within functional limitations   Extension   Right lateral flexion   Left lateral flexion   Right rotation   Left rotation    (Blank rows = not tested)  LOWER EXTREMITY ROM:  Passive ROM Right eval Left eval  Hip flexion Within functional limitations  Within functional limitations   Hip extension    Hip abduction    Hip adduction    Hip internal rotation 65 65  Hip external rotation 80 80  Knee flexion    Knee extension    Ankle dorsiflexion    Ankle plantarflexion    Ankle inversion    Ankle eversion     (Blank rows = not tested)  LOWER EXTREMITY MMT: 5/5 grossly overall  PALPATION:   General -  restrictions throughout abdomen, upper chest breathing                External Perineal Exam - tight and tender, elevated                              Internal Pelvic Floor-  tight and tender, pt with tremor with anxiety, offered to postpone pelvic exam, pt insisted to perform today  Patient confirms identification and approves PT to assess internal pelvic floor and treatment Yes  PELVIC MMT:   MMT eval  Vaginal 3/5  Internal Anal Sphincter   External Anal Sphincter   Puborectalis   Diastasis Recti no  (Blank rows = not tested)        TONE: high  PROLAPSE: None noticed in hooklying  TODAY'S TREATMENT:  DATE: 03/28/23  03/28/23  Manual- STM abdomen, adductors    Neuro reed- diaphragmatic breathing, HS, child's pose, butterfly stretch   Therapeutic activities- review of HEP, progress, stress management    PATIENT EDUCATION:  Education details: relevant anatomy, HEP, exam findings, diaphragmatic breathing reed, physical therapy recommendations for downtraining  Person educated: Patient Education method: Explanation, Demonstration, Tactile  cues, Verbal cues, and Handouts Education comprehension: verbalized understanding and needs further education  HOME EXERCISE PROGRAM: [id number]  Treatment instructions: First Select Initial or Subsequent and then (Multi-select), will start with all checked Initial Treatment: Pt instructed on dry needling rationale, procedures, and possible side effects. Pt instructed to expect mild to moderate muscle soreness later today and/or tomorrow. Pt instructed in methods to reduce muscle soreness. Pt instructed to continue prescribed HEP. Because dry needling was performed over or adjacent to a lung field, pt was educated on S/S of pneumothorax and to seek immediate medical attention should they occur.  Patient was educated on signs and symptoms of infection and other risk factors and advised to seek medical attention should they occur. Patient verbalized understanding of these instructions and education. Subsequent Treatment Instructions provided previously at initial dry needling treatment Instructions reviewed, if requested by the patient, prior to subsequent dry needling treatment  Patient Verbal Consent Given: Yes  Education handout provided: Yes (Yes/No/Previously Provided) Muscles treated: lumbar paraspinals and glutes Electrical stimulation performed: No (Yes/No) Parameters: N/A ((Will change to only populate if yes is selected) Treatment response/outcome: reduced pain    ASSESSMENT:  CLINICAL IMPRESSION: Pt did well with her exercises today. Discussed using dilator at home for layer 1 to reduce tension. Cont stress management, vagus activation and downtraining to improve hesitancy.  Pt will benefit from cont PT.      OBJECTIVE IMPAIRMENTS: decreased coordination, decreased ROM, increased fascial restrictions, increased muscle spasms, impaired tone, and pain.   ACTIVITY LIMITATIONS: toileting  PARTICIPATION LIMITATIONS: interpersonal relationship and community  activity  PERSONAL FACTORS: Behavior pattern are also affecting patient's functional outcome.   REHAB POTENTIAL: Good  CLINICAL DECISION MAKING: Stable/uncomplicated  EVALUATION COMPLEXITY: Low   GOALS: Goals reviewed with patient? Yes  SHORT TERM GOALS: Target date: 03/22/2023    Pt will be I with her HEP Baseline: Goal status: INITIAL  2.  Pt will be I with use of vibrator/ dilator to reduce pelvic floor tension  Baseline:  Goal status: INITIAL  3.  Pt will demonstrate improved bladder emptying to at least every 2 hours Baseline:  Goal status: INITIAL  4.  Pt will report reduced left hip to max 3/10 with walking for 30 mins Baseline:  Goal status: INITIAL   LONG TERM GOALS: Target date: 05/17/2023    Pt will demonstrate good AROM pelvic floor and ability to contract, relax and bulge Baseline:  Goal status: INITIAL  2.  Pt will be I with advanced HEP Baseline:  Goal status: INITIAL  3.  Pt will report normal bladder emptying and strong urine stream 100% of time. Baseline:  Goal status: INITIAL  4.  Pt will report max 2/10 left hip pain with sitting for 2 hrs Baseline:  Goal status: INITIAL   PLAN:  PT FREQUENCY: 1-2x/week  PT DURATION:  8 sessions  PLANNED INTERVENTIONS: 97110-Therapeutic exercises, 97530- Therapeutic activity, 97112- Neuromuscular re-education, 97535- Self Care, [id number]- Manual therapy, Dry Needling, Joint mobilization, Joint manipulation, Spinal manipulation, Spinal mobilization, Scar mobilization, and Biofeedback  PLAN FOR NEXT SESSION: continue down training, trial of dry needling   Gemini Beaumier,  PT 03/28/23 10:11 AM

## 2023-04-01 DIAGNOSIS — J3081 Allergic rhinitis due to animal (cat) (dog) hair and dander: Secondary | ICD-10-CM | POA: Diagnosis not present

## 2023-04-01 DIAGNOSIS — J301 Allergic rhinitis due to pollen: Secondary | ICD-10-CM | POA: Diagnosis not present

## 2023-04-01 DIAGNOSIS — J3089 Other allergic rhinitis: Secondary | ICD-10-CM | POA: Diagnosis not present

## 2023-04-06 DIAGNOSIS — Z20828 Contact with and (suspected) exposure to other viral communicable diseases: Secondary | ICD-10-CM | POA: Diagnosis not present

## 2023-04-06 DIAGNOSIS — J111 Influenza due to unidentified influenza virus with other respiratory manifestations: Secondary | ICD-10-CM | POA: Diagnosis not present

## 2023-04-06 DIAGNOSIS — J0100 Acute maxillary sinusitis, unspecified: Secondary | ICD-10-CM | POA: Diagnosis not present

## 2023-04-07 ENCOUNTER — Ambulatory Visit: Payer: BC Managed Care – PPO | Admitting: Physical Therapy

## 2023-04-12 DIAGNOSIS — J3089 Other allergic rhinitis: Secondary | ICD-10-CM | POA: Diagnosis not present

## 2023-04-12 DIAGNOSIS — J3081 Allergic rhinitis due to animal (cat) (dog) hair and dander: Secondary | ICD-10-CM | POA: Diagnosis not present

## 2023-04-12 DIAGNOSIS — J301 Allergic rhinitis due to pollen: Secondary | ICD-10-CM | POA: Diagnosis not present

## 2023-04-25 DIAGNOSIS — J301 Allergic rhinitis due to pollen: Secondary | ICD-10-CM | POA: Diagnosis not present

## 2023-04-25 DIAGNOSIS — J3089 Other allergic rhinitis: Secondary | ICD-10-CM | POA: Diagnosis not present

## 2023-04-25 DIAGNOSIS — J3081 Allergic rhinitis due to animal (cat) (dog) hair and dander: Secondary | ICD-10-CM | POA: Diagnosis not present

## 2023-05-04 DIAGNOSIS — N6320 Unspecified lump in the left breast, unspecified quadrant: Secondary | ICD-10-CM | POA: Diagnosis not present

## 2023-05-04 DIAGNOSIS — N644 Mastodynia: Secondary | ICD-10-CM | POA: Diagnosis not present

## 2023-05-04 DIAGNOSIS — J3081 Allergic rhinitis due to animal (cat) (dog) hair and dander: Secondary | ICD-10-CM | POA: Diagnosis not present

## 2023-05-04 DIAGNOSIS — J3089 Other allergic rhinitis: Secondary | ICD-10-CM | POA: Diagnosis not present

## 2023-05-04 DIAGNOSIS — J301 Allergic rhinitis due to pollen: Secondary | ICD-10-CM | POA: Diagnosis not present

## 2023-05-10 DIAGNOSIS — J301 Allergic rhinitis due to pollen: Secondary | ICD-10-CM | POA: Diagnosis not present

## 2023-05-10 DIAGNOSIS — J3089 Other allergic rhinitis: Secondary | ICD-10-CM | POA: Diagnosis not present

## 2023-05-10 DIAGNOSIS — J3081 Allergic rhinitis due to animal (cat) (dog) hair and dander: Secondary | ICD-10-CM | POA: Diagnosis not present

## 2023-05-18 DIAGNOSIS — J3081 Allergic rhinitis due to animal (cat) (dog) hair and dander: Secondary | ICD-10-CM | POA: Diagnosis not present

## 2023-05-18 DIAGNOSIS — J3089 Other allergic rhinitis: Secondary | ICD-10-CM | POA: Diagnosis not present

## 2023-05-18 DIAGNOSIS — J301 Allergic rhinitis due to pollen: Secondary | ICD-10-CM | POA: Diagnosis not present

## 2023-05-25 DIAGNOSIS — J301 Allergic rhinitis due to pollen: Secondary | ICD-10-CM | POA: Diagnosis not present

## 2023-05-25 DIAGNOSIS — J3081 Allergic rhinitis due to animal (cat) (dog) hair and dander: Secondary | ICD-10-CM | POA: Diagnosis not present

## 2023-05-25 DIAGNOSIS — J3089 Other allergic rhinitis: Secondary | ICD-10-CM | POA: Diagnosis not present

## 2023-05-26 ENCOUNTER — Encounter: Payer: Self-pay | Admitting: Physical Therapy

## 2023-05-26 ENCOUNTER — Ambulatory Visit: Attending: Obstetrics and Gynecology | Admitting: Physical Therapy

## 2023-05-26 DIAGNOSIS — M6281 Muscle weakness (generalized): Secondary | ICD-10-CM | POA: Insufficient documentation

## 2023-05-26 DIAGNOSIS — M62838 Other muscle spasm: Secondary | ICD-10-CM | POA: Insufficient documentation

## 2023-05-26 NOTE — Therapy (Addendum)
 OUTPATIENT PHYSICAL THERAPY FEMALE PELVIC TREATMENT/ REEVAL   Patient Name: Kristy Benton MRN: [id number] DOB:[birth year], 43 y.o., female Today's Date: 05/26/2023  END OF SESSION:  PT End of Session - 05/26/23 0845     Visit Number 5    Authorization Type bcbs [id number]  no auth req    PT Start Time 0800    PT Stop Time 0845    PT Time Calculation (min) 45 min    Activity Tolerance Patient tolerated treatment well    Behavior During Therapy [facility] for tasks assessed/performed                 Past Medical History:  Diagnosis Date   Depression    Uterine fibroid    Past Surgical History:  Procedure Laterality Date   MYOMECTOMY  11/03/2018   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   Vitamin B12 deficiency 11/30/2021   Allergic rhinitis 10/12/2021   Allergic rhinitis due to animal hair and dander 10/12/2021   Allergic rhinitis due to pollen 10/12/2021   Chronic allergic conjunctivitis 10/12/2021   Pain in finger of right hand 04/21/2020   Abnormal uterine bleeding (AUB) 10/19/2018   Double vision with both eyes open 05/19/2018   Recurrent sinusitis 11/02/2016   Allergy 11/02/2016    PCP: Philip Aspen, Limmie Patricia, MD  REFERRING PROVIDER: Jerrell Mylar, MD  REFERRING DIAG: M62.89 (ICD-10-CM) - High-tone pelvic floor dysfunction   THERAPY DIAG:  Other muscle spasm  Muscle weakness (generalized)  Rationale for Evaluation and Treatment: Rehabilitation  ONSET DATE: Nov 30th 2024  SUBJECTIVE:                                                                                                                                                                                           SUBJECTIVE STATEMENT:  Pt reports that she is doing well, but she has had some increased  mostly left hip pain.  Stretching gives no  relief. Agg- sitting It is not limiting her activity Relief- stretching to an extent, Pain 1-6/10   Urinary hesitancy overall better Pain feels pain on the bone- points to left ASIS  Lower Extremity Functional Scale Score: 69 points or 86.3 percent.   Pt has been going to her chiro, he has been adustting it. She gets temporary relief.But he recommended to go back to PT thinking this might ben related to pelvic floor.        Fluid intake: Yes: water a lot    PAIN:  Are you having pain? No, just a little on left hip, was much worse NPRS scale: 3/10 Pain location: left hip, feels like a pull, reports that she is sure pain is related  Pain type: tight Pain description: intermittent, getting better  Aggravating factors: not sure Relieving factors: moving  PRECAUTIONS: None  RED FLAGS: None   WEIGHT BEARING RESTRICTIONS: No  FALLS:  Has patient fallen in last 6 months? No  LIVING ENVIRONMENT: Lives with: lives with their daughter Lives in: House/apartment Stairs: No Has following equipment at home: None  OCCUPATION: [employer] benefits  PLOF: Independent  PATIENT GOALS: to maintain normalcy with urination  PERTINENT HISTORY:  Dec 2020 partial hysterectomy d/t fibroids Sexual abuse: No- but sex was not pleasant during her bad marriage  BOWEL MOVEMENT: NA  URINATION: Pain with urination: No Fully empty bladder: Yes:   Stream: Strong Urgency: No Frequency: every hour Leakage:  no Pads: No  INTERCOURSE: Pain with intercourse:  no Ability to have vaginal penetration:  No Climax: yes   PREGNANCY: Vaginal deliveries 1 Tearing yes C-section deliveries 0 Currently pregnant No  PROLAPSE: None   OBJECTIVE:  Note: Objective measures were completed at Evaluation unless otherwise noted.  COGNITION: Overall cognitive status: Within functional limits for tasks assessed     SENSATION: Light touch: Appears intact Proprioception: Appears intact  MUSCLE  LENGTH: Hamstrings: Right 80 deg; Left 80 deg   LUMBAR SPECIAL TESTS:  Straight leg raise test: Negative  GAIT: no issues   POSTURE: rounded shoulders, forward head, and increased lumbar lordosis  PELVIC ALIGNMENT: seems even  LUMBARAROM/PROM:  A/PROM A/PROM  eval  Flexion Within functional limitations   Extension   Right lateral flexion   Left lateral flexion   Right rotation   Left rotation    (Blank rows = not tested)  LOWER EXTREMITY ROM:  Passive ROM Right eval Left eval  Hip flexion Within functional limitations  Within functional limitations   Hip extension    Hip abduction    Hip adduction    Hip internal rotation 65 65  Hip external rotation 80 80  Knee flexion    Knee extension    Ankle dorsiflexion    Ankle plantarflexion    Ankle inversion    Ankle eversion     (Blank rows = not tested)  LOWER EXTREMITY MMT: 4-5/LLE  PALPATION:   General -  restrictions throughout abdomen, upper chest breathing                External Perineal Exam - tight and tender, elevated                              Internal Pelvic Floor-  tight and tender, pt with tremor with anxiety, offered to postpone pelvic exam, pt insisted to perform today  Patient confirms identification and approves PT to assess internal pelvic floor and treatment Yes  PELVIC MMT:   MMT eval  Vaginal 3/5  Internal Anal Sphincter   External Anal Sphincter   Puborectalis   Diastasis Recti no  (Blank rows = not tested)        TONE: high  PROLAPSE: None noticed in hooklying  TODAY'S TREATMENT:  DATE: 05/26/23  05/26/23   Reeval Dry needling - left gastroc- very tender TPs   There ex- standing gastroc stretch  TPs- left gastroc, rectus abdominis  Weakness LLE 4-/5 in comparison to right LE  Access Code: [email or web address] URL:  [email or web address] Date: 05/26/2023 Prepared by: Darrell Jewel Gara Kincade  Exercises - Straight Leg Raise  - 1 x daily - 7 x weekly - 3 sets - 10 reps - Full Leg Press  - 1 x daily - 7 x weekly - 3 sets - 10 reps - Supine Bridge  - 1 x daily - 7 x weekly - 3 sets - 10 reps - Sidelying Hip Abduction  - 1 x daily - 7 x weekly - 3 sets - 10 reps - Side Stepping with Resistance at Ankles  - 1 x daily - 7 x weekly - 3 sets - 10 reps - Clamshell with Resistance  - 1 x daily - 7 x weekly - 3 sets - 10 reps - Standing Gastroc Stretch  - 1 x daily - 7 x weekly - 3 sets - 10 reps  Patient Education - Get To Know Your Pelvic Floor- Female   HOME EXERCISE PROGRAM: [email or web address]  Treatment instructions: First Select Initial or Subsequent and then (Multi-select), will start with all checked Initial Treatment: Pt instructed on dry needling rationale, procedures, and possible side effects. Pt instructed to expect mild to moderate muscle soreness later today and/or tomorrow. Pt instructed in methods to reduce muscle soreness. Pt instructed to continue prescribed HEP. Because dry needling was performed over or adjacent to a lung field, pt was educated on S/S of pneumothorax and to seek immediate medical attention should they occur.  Patient was educated on signs and symptoms of infection and other risk factors and advised to seek medical attention should they occur. Patient verbalized understanding of these instructions and education. Subsequent Treatment Instructions provided previously at initial dry needling treatment Instructions reviewed, if requested by the patient, prior to subsequent dry needling treatment  Patient Verbal Consent Given: Yes  Education handout provided: Yes (Yes/No/Previously Provided) Muscles treated: left gastroc Electrical stimulation performed: No (Yes/No) Parameters: N/A ((Will change to only populate if yes is selected) Treatment response/outcome: reduced pain     ASSESSMENT:  CLINICAL IMPRESSION: Pt with TPs left gastroc, weakness in left knee and hip 4-/5. No pain reproduced in left hip joint with scour- seems like there is not labral involvement, likely compensating for weakness with muscle gripping. Did well today with education and will focus on strengthening left hip and knees to reduce hip pain. Discussed pelvic PT vs ortho.   Pt will benefit from cont PT.      OBJECTIVE IMPAIRMENTS: decreased coordination, decreased ROM, increased fascial restrictions, increased muscle spasms, impaired tone, and pain.   ACTIVITY LIMITATIONS: toileting  PARTICIPATION LIMITATIONS: interpersonal relationship and community activity  PERSONAL FACTORS: Behavior pattern are also affecting patient's functional outcome.   REHAB POTENTIAL: Good  CLINICAL DECISION MAKING: Stable/uncomplicated  EVALUATION COMPLEXITY: Low   GOALS: Goals reviewed with patient? Yes  SHORT TERM GOALS: Target date: 03/22/2023 updated 3/20 - 11/26/2023    Pt will be I with her HEP Baseline: Goal status: met  2.  Pt will be I with use of vibrator/ dilator to reduce pelvic floor tension  Baseline:  Goal status: met  3.  Pt will demonstrate improved bladder emptying to at least every 2 hours Baseline:  Goal status: met  4.  Pt will report reduced left hip to max 3/10  with walking for 30 mins Baseline: 6/10 Goal status: 2/10  LONG TERM GOALS: Target date: 05/17/2023 updated 11/26/23    Pt will demonstrate good AROM pelvic floor and ability to contract, relax and bulge Baseline:  Goal status: met  2.  Pt will be I with advanced HEP Baseline:  Goal status: initial  3.  Pt will report normal bladder emptying and strong urine stream 100% of time. Baseline:  Goal status: progressing  4.  Pt will report max 2/10 left hip pain with sitting for 2 hrs Baseline:  Goal status: INITIAL  5. Pt will have increased left hip and knee and akle strength to 5/5 Status-  initial Baseline 4-/5   PLAN:  PT FREQUENCY: 1-2x/week  PT DURATION:  8 sessions  PLANNED INTERVENTIONS: 97110-Therapeutic exercises, 97530- Therapeutic activity, 97112- Neuromuscular re-education, 97535- Self Care, 84132- Manual therapy, Dry Needling, Joint mobilization, Joint manipulation, Spinal manipulation, Spinal mobilization, Scar mobilization, and Biofeedback  PLAN FOR NEXT SESSION: continue strengthening, trial of dry needling   Lucia Mccreadie, PT 05/26/23 8:47 AM

## 2023-05-26 NOTE — Addendum Note (Signed)
 Addended byBeckie Salts on: 05/26/2023 10:59 AM   Modules accepted: Orders

## 2023-05-29 DIAGNOSIS — S92405A Nondisplaced unspecified fracture of left great toe, initial encounter for closed fracture: Secondary | ICD-10-CM | POA: Diagnosis not present

## 2023-05-30 ENCOUNTER — Encounter: Payer: Self-pay | Admitting: Physical Therapy

## 2023-06-03 DIAGNOSIS — J3089 Other allergic rhinitis: Secondary | ICD-10-CM | POA: Diagnosis not present

## 2023-06-03 DIAGNOSIS — J3081 Allergic rhinitis due to animal (cat) (dog) hair and dander: Secondary | ICD-10-CM | POA: Diagnosis not present

## 2023-06-03 DIAGNOSIS — J301 Allergic rhinitis due to pollen: Secondary | ICD-10-CM | POA: Diagnosis not present

## 2023-06-07 ENCOUNTER — Ambulatory Visit: Payer: Self-pay | Admitting: Physical Therapy

## 2023-06-09 DIAGNOSIS — J3081 Allergic rhinitis due to animal (cat) (dog) hair and dander: Secondary | ICD-10-CM | POA: Diagnosis not present

## 2023-06-09 DIAGNOSIS — J3089 Other allergic rhinitis: Secondary | ICD-10-CM | POA: Diagnosis not present

## 2023-06-09 DIAGNOSIS — J301 Allergic rhinitis due to pollen: Secondary | ICD-10-CM | POA: Diagnosis not present

## 2023-06-10 DIAGNOSIS — D2272 Melanocytic nevi of left lower limb, including hip: Secondary | ICD-10-CM | POA: Diagnosis not present

## 2023-06-13 ENCOUNTER — Ambulatory Visit: Payer: Self-pay | Admitting: Physical Therapy

## 2023-06-17 DIAGNOSIS — J3089 Other allergic rhinitis: Secondary | ICD-10-CM | POA: Diagnosis not present

## 2023-06-17 DIAGNOSIS — J301 Allergic rhinitis due to pollen: Secondary | ICD-10-CM | POA: Diagnosis not present

## 2023-06-27 DIAGNOSIS — Z1331 Encounter for screening for depression: Secondary | ICD-10-CM | POA: Diagnosis not present

## 2023-06-27 DIAGNOSIS — Z124 Encounter for screening for malignant neoplasm of cervix: Secondary | ICD-10-CM | POA: Diagnosis not present

## 2023-06-27 DIAGNOSIS — Z800 Family history of malignant neoplasm of digestive organs: Secondary | ICD-10-CM | POA: Diagnosis not present

## 2023-06-27 DIAGNOSIS — Z01419 Encounter for gynecological examination (general) (routine) without abnormal findings: Secondary | ICD-10-CM | POA: Diagnosis not present

## 2023-06-27 DIAGNOSIS — Z1231 Encounter for screening mammogram for malignant neoplasm of breast: Secondary | ICD-10-CM | POA: Diagnosis not present

## 2023-06-27 LAB — HM MAMMOGRAPHY

## 2023-06-28 DIAGNOSIS — J301 Allergic rhinitis due to pollen: Secondary | ICD-10-CM | POA: Diagnosis not present

## 2023-06-28 DIAGNOSIS — J3089 Other allergic rhinitis: Secondary | ICD-10-CM | POA: Diagnosis not present

## 2023-06-28 DIAGNOSIS — J3081 Allergic rhinitis due to animal (cat) (dog) hair and dander: Secondary | ICD-10-CM | POA: Diagnosis not present

## 2023-07-01 ENCOUNTER — Ambulatory Visit: Attending: Obstetrics and Gynecology | Admitting: Physical Therapy

## 2023-07-01 ENCOUNTER — Encounter: Payer: Self-pay | Admitting: Physical Therapy

## 2023-07-01 DIAGNOSIS — M6281 Muscle weakness (generalized): Secondary | ICD-10-CM | POA: Insufficient documentation

## 2023-07-01 DIAGNOSIS — M62838 Other muscle spasm: Secondary | ICD-10-CM | POA: Insufficient documentation

## 2023-07-01 NOTE — Therapy (Signed)
 OUTPATIENT PHYSICAL THERAPY FEMALE PELVIC TREATMENT/ REEVAL   Patient Name: Kristy Benton MRN: [id number] DOB:04-19-1980, 43 y.o., female Today's Date: 07/01/2023  END OF SESSION:  PT End of Session - 07/01/23 1028     Visit Number 6    Authorization Type bcbs [id number]  no auth req    PT Start Time 1020    PT Stop Time 1100    PT Time Calculation (min) 40 min    Activity Tolerance Patient tolerated treatment well    Behavior During Therapy 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   Vitamin B12 deficiency 11/30/2021   Allergic rhinitis 10/12/2021   Allergic rhinitis due to animal hair and dander 10/12/2021   Allergic rhinitis due to pollen 10/12/2021   Chronic allergic conjunctivitis 10/12/2021   Pain in finger of right hand 04/21/2020   Abnormal uterine bleeding (AUB) 10/19/2018   Double vision with both eyes open 05/19/2018   Recurrent sinusitis 11/02/2016   Allergy 11/02/2016    PCP: Zilphia Hilt, Charyl Coppersmith, MD  REFERRING PROVIDER: Roby Chris, MD  REFERRING DIAG: M62.89 (ICD-10-CM) - High-tone pelvic floor dysfunction   THERAPY DIAG:  Other muscle spasm  Muscle weakness (generalized)  Rationale for Evaluation and Treatment: Rehabilitation  ONSET DATE: Nov 30th 2024  SUBJECTIVE:                                                                                                                                                                                           SUBJECTIVE STATEMENT:  Pt reports that her left calf is so tight, left hip has been tight and sore as well, got adjusted by a chiropractor  and it felt better. Uses massage gun and it helps with clenching.  She had a couple of times when she had some urinary hesitancy, but was able to breathe trough it.    Lower Extremity Functional Scale Score: 69 points or 86.3 percent.        Fluid intake: Yes: water a lot    PAIN:  Are you having pain? No, just a little on left hip, was much worse NPRS scale: 3/10 Pain location: left hip, feels like a pull, reports that she is  sure pain is related  Pain type: tight Pain description: intermittent, getting better  Aggravating factors: not sure Relieving factors: moving  PRECAUTIONS: None  RED FLAGS: None   WEIGHT BEARING RESTRICTIONS: No  FALLS:  Has patient fallen in last 6 months? No  LIVING ENVIRONMENT: Lives with: lives with their daughter Lives in: House/apartment Stairs: No Has following equipment at home: None  OCCUPATION: [employer] benefits  PLOF: Independent  PATIENT GOALS: to maintain normalcy with urination  PERTINENT HISTORY:  Dec 2020 partial hysterectomy d/t fibroids Sexual abuse: No- but sex was not pleasant during her bad marriage  BOWEL MOVEMENT: NA  URINATION: Pain with urination: No Fully empty bladder: Yes:   Stream: Strong Urgency: No Frequency: every hour Leakage:  no Pads: No  INTERCOURSE: Pain with intercourse:  no Ability to have vaginal penetration:  No Climax: yes   PREGNANCY: Vaginal deliveries 1 Tearing yes C-section deliveries 0 Currently pregnant No  PROLAPSE: None   OBJECTIVE:  Note: Objective measures were completed at Evaluation unless otherwise noted.  COGNITION: Overall cognitive status: Within functional limits for tasks assessed     SENSATION: Light touch: Appears intact Proprioception: Appears intact  MUSCLE LENGTH: Hamstrings: Right 80 deg; Left 80 deg   LUMBAR SPECIAL TESTS:  Straight leg raise test: Negative  GAIT: no issues   POSTURE: rounded shoulders, forward head, and increased  lumbar lordosis  PELVIC ALIGNMENT: seems even  LUMBARAROM/PROM:  A/PROM A/PROM  eval  Flexion 50  Extension   Right lateral flexion   Left lateral flexion   Right rotation   Left rotation    (Blank rows = not tested)  LOWER EXTREMITY ROM:  Passive ROM Right eval Left eval  Hip flexion Within functional limitations  Within functional limitations   Hip extension    Hip abduction    Hip adduction    Hip internal rotation 65 65  Hip external rotation 80 80  Knee flexion    Knee extension    Ankle dorsiflexion    Ankle plantarflexion    Ankle inversion    Ankle eversion     (Blank rows = not tested)  LOWER EXTREMITY MMT: 4-5/LLE  PALPATION:   General -  restrictions throughout abdomen, upper chest breathing                External Perineal Exam - tight and tender, elevated                              Internal Pelvic Floor-  tight and tender, pt with tremor with anxiety, offered to postpone pelvic exam, pt insisted to perform today  Patient confirms identification and approves PT to assess internal pelvic floor and treatment Yes  PELVIC MMT:   MMT eval  Vaginal 3/5  Internal Anal Sphincter   External Anal Sphincter   Puborectalis   Diastasis Recti no  (Blank rows = not tested)        TONE: high  PROLAPSE: None noticed in hooklying  TODAY'S TREATMENT:  DATE: 07/01/23  07/01/23    Manual- IASTM and Dry needling - left gastroc- tender TPs   There ex- standing gastroc stretch, heel raises  TPs- left gastroc medially  Weakness LLE 4-/5 in comparison to right LE  Access Code: [email or web address] URL: [email or web address] Date: 05/26/2023 Prepared by: Jameson Mcburney Teasia Zapf  Exercises - Straight Leg Raise  - 1 x daily - 7 x weekly - 3 sets - 10 reps - Full Leg Press  - 1 x daily - 7 x weekly - 3 sets - 10 reps - Supine Bridge  -  1 x daily - 7 x weekly - 3 sets - 10 reps - Sidelying Hip Abduction  - 1 x daily - 7 x weekly - 3 sets - 10 reps - Side Stepping with Resistance at Ankles  - 1 x daily - 7 x weekly - 3 sets - 10 reps - Clamshell with Resistance  - 1 x daily - 7 x weekly - 3 sets - 10 reps - Standing Gastroc Stretch  - 1 x daily - 7 x weekly - 3 sets - 10 reps  Patient Education - Get To Know Your Pelvic Floor- Female   HOME EXERCISE PROGRAM: [email or web address]  Treatment instructions: First Select Initial or Subsequent and then (Multi-select), will start with all checked Initial Treatment: Pt instructed on dry needling rationale, procedures, and possible side effects. Pt instructed to expect mild to moderate muscle soreness later today and/or tomorrow. Pt instructed in methods to reduce muscle soreness. Pt instructed to continue prescribed HEP. Because dry needling was performed over or adjacent to a lung field, pt was educated on S/S of pneumothorax and to seek immediate medical attention should they occur.  Patient was educated on signs and symptoms of infection and other risk factors and advised to seek medical attention should they occur. Patient verbalized understanding of these instructions and education. Subsequent Treatment Instructions provided previously at initial dry needling treatment Instructions reviewed, if requested by the patient, prior to subsequent dry needling treatment  Patient Verbal Consent Given: Yes  Education handout provided: Yes (Muscles treated: left gastroc Electrical stimulation performed: No  Parameters: N/A Treatment response/outcome: reduced pain    ASSESSMENT:  CLINICAL IMPRESSION: Pt with TPs left gastroc, weakness in left knee and hip 4-/5. Discussed pelvic PT vs ortho. Did well with dry needling and IASTM  Pt will benefit from cont PT.      OBJECTIVE IMPAIRMENTS: decreased coordination, decreased ROM, increased fascial restrictions, increased muscle spasms,  impaired tone, and pain.   ACTIVITY LIMITATIONS: toileting  PARTICIPATION LIMITATIONS: interpersonal relationship and community activity  PERSONAL FACTORS: Behavior pattern are also affecting patient's functional outcome.   REHAB POTENTIAL: Good  CLINICAL DECISION MAKING: Stable/uncomplicated  EVALUATION COMPLEXITY: Low   GOALS: Goals reviewed with patient? Yes  SHORT TERM GOALS: Target date: 03/22/2023 updated 3/20 - 11/26/2023    Pt will be I with her HEP Baseline: Goal status: met  2.  Pt will be I with use of vibrator/ dilator to reduce pelvic floor tension  Baseline:  Goal status: met  3.  Pt will demonstrate improved bladder emptying to at least every 2 hours Baseline:  Goal status: met  4.  Pt will report reduced left hip to max 3/10 with walking for 30 mins Baseline: 6/10 Goal status: 2/10  LONG TERM GOALS: Target date: 05/17/2023 updated 11/26/23    Pt will demonstrate good AROM pelvic floor and ability to contract, relax and bulge Baseline:  Goal status: met  2.  Pt will be I with advanced HEP Baseline:  Goal status: initial  3.  Pt will report normal bladder emptying and strong urine stream 100% of time. Baseline:  Goal status: progressing  4.  Pt will report max 2/10 left hip pain with sitting for 2 hrs Baseline:  Goal status: INITIAL  5. Pt will have increased left hip and knee and akle strength to 5/5 Status- initial Baseline 4-/5   PLAN:  PT FREQUENCY: 1-2x/week  PT DURATION:  8 sessions  PLANNED INTERVENTIONS: 97110-Therapeutic exercises, 97530- Therapeutic activity, 97112- Neuromuscular re-education, 97535- Self Care, 16109- Manual therapy, Dry Needling, Joint mobilization, Joint manipulation, Spinal manipulation, Spinal mobilization, Scar mobilization, and Biofeedback  PLAN FOR NEXT SESSION: continue strengthening, trial of dry needling   Veniamin Kincaid, PT 07/01/23 10:28 AM

## 2023-07-05 DIAGNOSIS — J3089 Other allergic rhinitis: Secondary | ICD-10-CM | POA: Diagnosis not present

## 2023-07-05 DIAGNOSIS — J301 Allergic rhinitis due to pollen: Secondary | ICD-10-CM | POA: Diagnosis not present

## 2023-07-05 DIAGNOSIS — J3081 Allergic rhinitis due to animal (cat) (dog) hair and dander: Secondary | ICD-10-CM | POA: Diagnosis not present

## 2023-07-10 IMAGING — DX DG CHEST 2V
2 series · 2 of 2 positions shown · non-contrast
Comparison: None.

CLINICAL DATA: CP, thoracic back pain

EXAM:
CHEST - 2 VIEW

[chest pa]
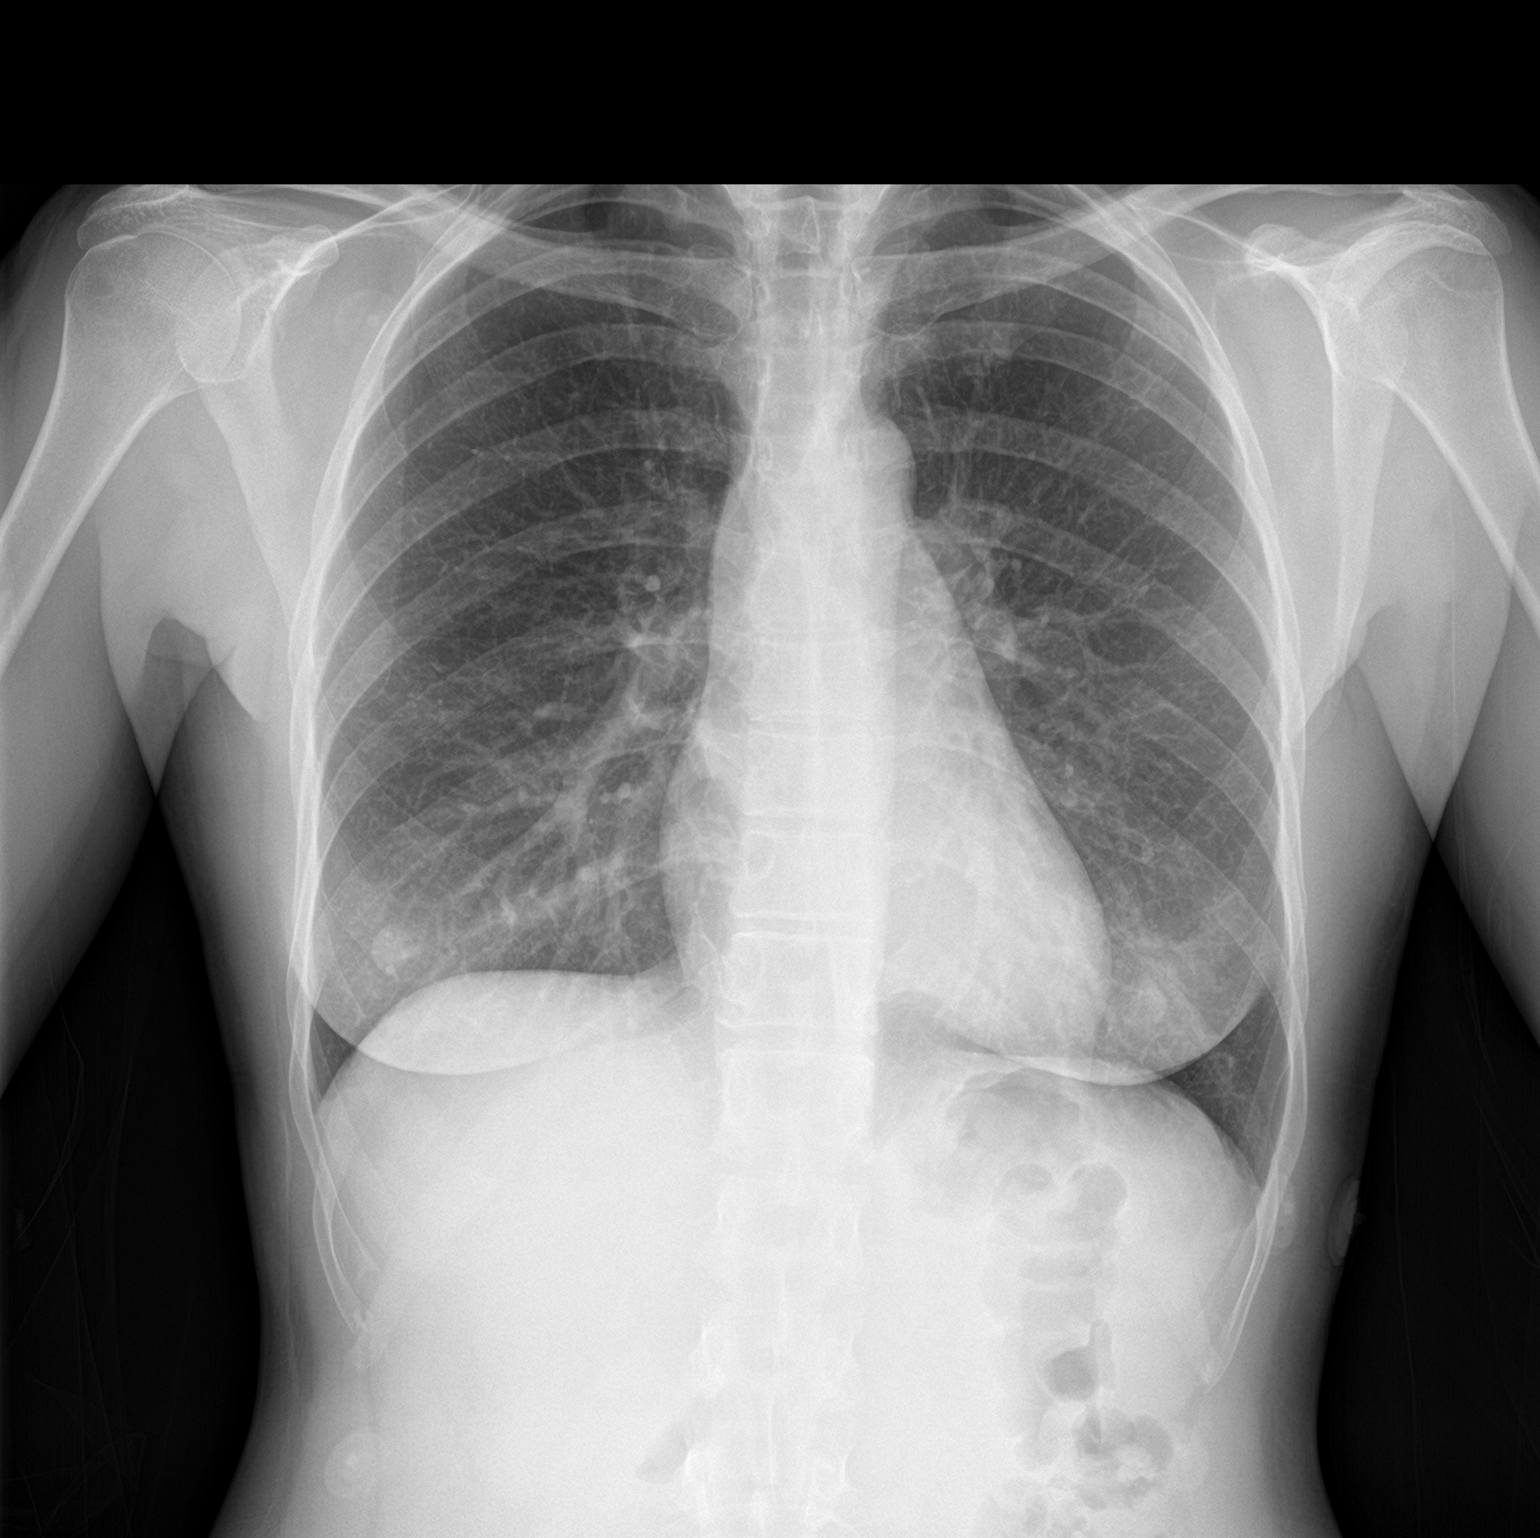

[chest lat]
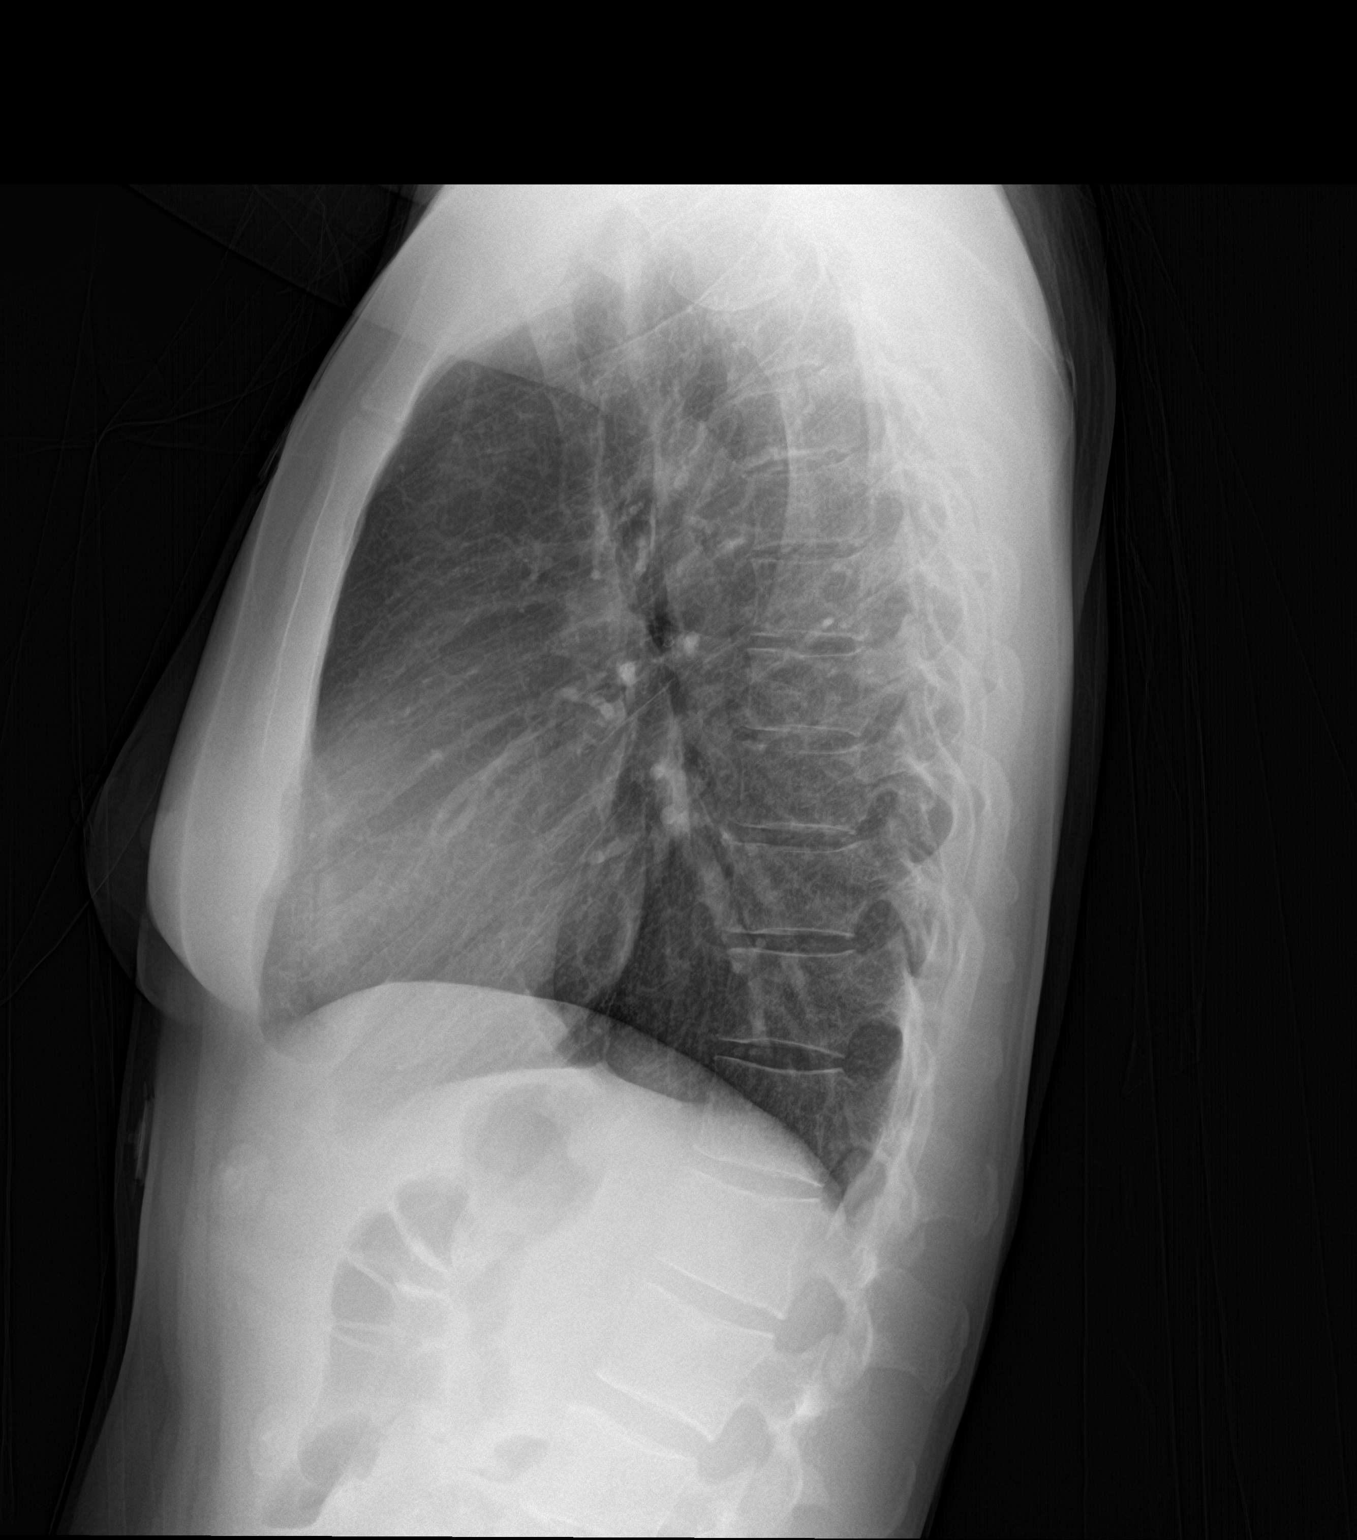

[2 of 2 positions shown; findings below may reference images not displayed]

FINDINGS: No consolidation. Prominent nipple shadows. No visible pleural
effusions or pneumothorax. Cardiomediastinal silhouette is within
normal limits. No evidence of acute osseous abnormality.
IMPRESSION: No evidence of acute cardiopulmonary disease.

## 2023-07-13 DIAGNOSIS — J301 Allergic rhinitis due to pollen: Secondary | ICD-10-CM | POA: Diagnosis not present

## 2023-07-13 DIAGNOSIS — J3089 Other allergic rhinitis: Secondary | ICD-10-CM | POA: Diagnosis not present

## 2023-07-13 DIAGNOSIS — J3081 Allergic rhinitis due to animal (cat) (dog) hair and dander: Secondary | ICD-10-CM | POA: Diagnosis not present

## 2023-07-20 DIAGNOSIS — J301 Allergic rhinitis due to pollen: Secondary | ICD-10-CM | POA: Diagnosis not present

## 2023-07-20 DIAGNOSIS — J3089 Other allergic rhinitis: Secondary | ICD-10-CM | POA: Diagnosis not present

## 2023-07-20 DIAGNOSIS — J3081 Allergic rhinitis due to animal (cat) (dog) hair and dander: Secondary | ICD-10-CM | POA: Diagnosis not present

## 2023-07-27 ENCOUNTER — Ambulatory Visit (INDEPENDENT_AMBULATORY_CARE_PROVIDER_SITE_OTHER): Admitting: Podiatrist

## 2023-07-27 ENCOUNTER — Encounter: Payer: Self-pay | Admitting: Podiatrist

## 2023-07-27 DIAGNOSIS — S90222A Contusion of left lesser toe(s) with damage to nail, initial encounter: Secondary | ICD-10-CM

## 2023-07-27 NOTE — Progress Notes (Signed)
 Chief Complaint  Patient presents with   Nail Problem    "I dropped an Ipad with a hard case on my toe.  The nail is not healing." N - toenail L - hallux left D - injured on 05/28/2023 O - suddenly C - discolored, detached a little A - none T - Anti-fungal liquid     HPI: Patient is 43 y.o. female who presents today for concerns as listed above- history confirmed with the patient.  She relates the nail is non painful but it has detatched and is loose.   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   Vitamin B12 deficiency 11/30/2021   Allergic rhinitis 10/12/2021   Allergic rhinitis due to animal hair and dander 10/12/2021   Allergic rhinitis due to pollen 10/12/2021   Chronic allergic conjunctivitis 10/12/2021   Pain in finger of right hand 04/21/2020   Abnormal uterine bleeding (AUB) 10/19/2018   Double vision with both eyes open 05/19/2018   Recurrent sinusitis 11/02/2016   Allergy 11/02/2016    Current Outpatient Medications on File Prior to Visit  Medication Sig Dispense Refill   ALPRAZolam  (XANAX ) 0.5 MG tablet TAKE 1 TABLET BY MOUTH DAILY AS NEEDED FOR ANXIETY 10 tablet 0   buPROPion  (WELLBUTRIN  XL) 150 MG 24 hr tablet TAKE 1 TABLET BY MOUTH DAILY 90 tablet 1   Carboxymethylcellulose Sod PF (REFRESH PLUS) 0.5 % SOLN 1 drop 2 (two) times a day.     desloratadine (CLARINEX) 5 MG tablet Take 5 mg by mouth daily.     EPINEPHrine 0.3 mg/0.3 mL IJ SOAJ injection      fluticasone (FLONASE) 50 MCG/ACT nasal spray Place 2 sprays into both nostrils daily.     montelukast (SINGULAIR) 10 MG tablet Take 10 mg by mouth at bedtime.     Probiotic Product (PROBIOTIC-10 PO) Take by mouth.     albuterol (VENTOLIN HFA) 108 (90 Base) MCG/ACT inhaler Inhale into the lungs.     No current facility-administered medications on file prior to visit.     Allergies  Allergen Reactions   Amoxicillin Hives and Other (See Comments)    Review of Systems No fevers, chills, nausea, muscle aches, no difficulty breathing, no calf pain, no chest pain or shortness of breath.   Physical Exam  GENERAL APPEARANCE: Alert, conversant. Appropriately groomed. No acute distress.   VASCULAR: Pedal pulses palpable 2/4 DP and  PT bilateral.  Capillary refill time is immediate to all digits,  Proximal to distal cooling is warm to warm.  Digital perfusion adequate.   NEUROLOGIC: sensation is intact to 5.07 monofilament at 5/5 sites bilateral.  Light touch is intact bilateral, vibratory sensation intact bilateral  MUSCULOSKELETAL: acceptable muscle strength, tone and stability bilateral.  No gross boney pedal deformities noted.  No pain, crepitus or limitation noted with foot and ankle range of motion bilateral.   DERMATOLOGIC: skin is warm, supple, and dry.  Left hallux nail has loosened and is coming off.  Underneath this is a new nail growing out.  No sign of infection noted.   Assessment     ICD-10-CM   1. Contusion of toenail of left foot, initial encounter  Z61.096E        Plan  Debrided the loose nail without anesthesia.  Recommended cleansing with hydrogen peroxide to get the nail bed clean.  Also recommended she massage the  end of the toe pulp down so the new nail can grow back normally.  She will return as needed for follow up.

## 2023-07-29 DIAGNOSIS — J301 Allergic rhinitis due to pollen: Secondary | ICD-10-CM | POA: Diagnosis not present

## 2023-07-29 DIAGNOSIS — J3089 Other allergic rhinitis: Secondary | ICD-10-CM | POA: Diagnosis not present

## 2023-07-29 DIAGNOSIS — J3081 Allergic rhinitis due to animal (cat) (dog) hair and dander: Secondary | ICD-10-CM | POA: Diagnosis not present

## 2023-08-05 DIAGNOSIS — J3081 Allergic rhinitis due to animal (cat) (dog) hair and dander: Secondary | ICD-10-CM | POA: Diagnosis not present

## 2023-08-05 DIAGNOSIS — J301 Allergic rhinitis due to pollen: Secondary | ICD-10-CM | POA: Diagnosis not present

## 2023-08-05 DIAGNOSIS — J3089 Other allergic rhinitis: Secondary | ICD-10-CM | POA: Diagnosis not present

## 2023-08-11 DIAGNOSIS — J301 Allergic rhinitis due to pollen: Secondary | ICD-10-CM | POA: Diagnosis not present

## 2023-08-11 DIAGNOSIS — J3089 Other allergic rhinitis: Secondary | ICD-10-CM | POA: Diagnosis not present

## 2023-08-11 DIAGNOSIS — Z0000 Encounter for general adult medical examination without abnormal findings: Secondary | ICD-10-CM | POA: Diagnosis not present

## 2023-08-11 DIAGNOSIS — F419 Anxiety disorder, unspecified: Secondary | ICD-10-CM | POA: Diagnosis not present

## 2023-08-11 DIAGNOSIS — Z6822 Body mass index (BMI) 22.0-22.9, adult: Secondary | ICD-10-CM | POA: Diagnosis not present

## 2023-08-11 DIAGNOSIS — Z13220 Encounter for screening for lipoid disorders: Secondary | ICD-10-CM | POA: Diagnosis not present

## 2023-08-11 DIAGNOSIS — J3081 Allergic rhinitis due to animal (cat) (dog) hair and dander: Secondary | ICD-10-CM | POA: Diagnosis not present

## 2023-08-22 DIAGNOSIS — J301 Allergic rhinitis due to pollen: Secondary | ICD-10-CM | POA: Diagnosis not present

## 2023-08-22 DIAGNOSIS — J3089 Other allergic rhinitis: Secondary | ICD-10-CM | POA: Diagnosis not present

## 2023-08-22 DIAGNOSIS — J3081 Allergic rhinitis due to animal (cat) (dog) hair and dander: Secondary | ICD-10-CM | POA: Diagnosis not present

## 2023-08-29 DIAGNOSIS — J3081 Allergic rhinitis due to animal (cat) (dog) hair and dander: Secondary | ICD-10-CM | POA: Diagnosis not present

## 2023-08-29 DIAGNOSIS — J301 Allergic rhinitis due to pollen: Secondary | ICD-10-CM | POA: Diagnosis not present

## 2023-08-29 DIAGNOSIS — J3089 Other allergic rhinitis: Secondary | ICD-10-CM | POA: Diagnosis not present

## 2023-08-31 ENCOUNTER — Other Ambulatory Visit: Payer: Self-pay | Admitting: Internal Medicine

## 2023-09-08 DIAGNOSIS — H1045 Other chronic allergic conjunctivitis: Secondary | ICD-10-CM | POA: Diagnosis not present

## 2023-09-08 DIAGNOSIS — J3081 Allergic rhinitis due to animal (cat) (dog) hair and dander: Secondary | ICD-10-CM | POA: Diagnosis not present

## 2023-09-08 DIAGNOSIS — J3089 Other allergic rhinitis: Secondary | ICD-10-CM | POA: Diagnosis not present

## 2023-09-08 DIAGNOSIS — J45990 Exercise induced bronchospasm: Secondary | ICD-10-CM | POA: Diagnosis not present

## 2023-09-08 DIAGNOSIS — J301 Allergic rhinitis due to pollen: Secondary | ICD-10-CM | POA: Diagnosis not present

## 2023-10-10 DIAGNOSIS — R058 Other specified cough: Secondary | ICD-10-CM | POA: Diagnosis not present

## 2023-10-21 DIAGNOSIS — J3089 Other allergic rhinitis: Secondary | ICD-10-CM | POA: Diagnosis not present

## 2023-10-21 DIAGNOSIS — J301 Allergic rhinitis due to pollen: Secondary | ICD-10-CM | POA: Diagnosis not present

## 2023-10-21 DIAGNOSIS — J3081 Allergic rhinitis due to animal (cat) (dog) hair and dander: Secondary | ICD-10-CM | POA: Diagnosis not present

## 2023-10-28 DIAGNOSIS — J3089 Other allergic rhinitis: Secondary | ICD-10-CM | POA: Diagnosis not present

## 2023-10-28 DIAGNOSIS — J301 Allergic rhinitis due to pollen: Secondary | ICD-10-CM | POA: Diagnosis not present

## 2023-10-28 DIAGNOSIS — J3081 Allergic rhinitis due to animal (cat) (dog) hair and dander: Secondary | ICD-10-CM | POA: Diagnosis not present

## 2023-11-01 DIAGNOSIS — M9901 Segmental and somatic dysfunction of cervical region: Secondary | ICD-10-CM | POA: Diagnosis not present

## 2023-11-01 DIAGNOSIS — M5030 Other cervical disc degeneration, unspecified cervical region: Secondary | ICD-10-CM | POA: Diagnosis not present

## 2023-11-01 DIAGNOSIS — G4486 Cervicogenic headache: Secondary | ICD-10-CM | POA: Diagnosis not present

## 2023-11-01 DIAGNOSIS — M9903 Segmental and somatic dysfunction of lumbar region: Secondary | ICD-10-CM | POA: Diagnosis not present

## 2023-11-04 DIAGNOSIS — G4486 Cervicogenic headache: Secondary | ICD-10-CM | POA: Diagnosis not present

## 2023-11-04 DIAGNOSIS — M5030 Other cervical disc degeneration, unspecified cervical region: Secondary | ICD-10-CM | POA: Diagnosis not present

## 2023-11-04 DIAGNOSIS — J301 Allergic rhinitis due to pollen: Secondary | ICD-10-CM | POA: Diagnosis not present

## 2023-11-04 DIAGNOSIS — M9901 Segmental and somatic dysfunction of cervical region: Secondary | ICD-10-CM | POA: Diagnosis not present

## 2023-11-04 DIAGNOSIS — J3089 Other allergic rhinitis: Secondary | ICD-10-CM | POA: Diagnosis not present

## 2023-11-04 DIAGNOSIS — M9903 Segmental and somatic dysfunction of lumbar region: Secondary | ICD-10-CM | POA: Diagnosis not present

## 2023-11-04 DIAGNOSIS — J3081 Allergic rhinitis due to animal (cat) (dog) hair and dander: Secondary | ICD-10-CM | POA: Diagnosis not present

## 2023-11-08 DIAGNOSIS — M5030 Other cervical disc degeneration, unspecified cervical region: Secondary | ICD-10-CM | POA: Diagnosis not present

## 2023-11-08 DIAGNOSIS — G4486 Cervicogenic headache: Secondary | ICD-10-CM | POA: Diagnosis not present

## 2023-11-08 DIAGNOSIS — M9903 Segmental and somatic dysfunction of lumbar region: Secondary | ICD-10-CM | POA: Diagnosis not present

## 2023-11-08 DIAGNOSIS — M9901 Segmental and somatic dysfunction of cervical region: Secondary | ICD-10-CM | POA: Diagnosis not present

## 2023-11-14 DIAGNOSIS — G4486 Cervicogenic headache: Secondary | ICD-10-CM | POA: Diagnosis not present

## 2023-11-14 DIAGNOSIS — J3081 Allergic rhinitis due to animal (cat) (dog) hair and dander: Secondary | ICD-10-CM | POA: Diagnosis not present

## 2023-11-14 DIAGNOSIS — M5030 Other cervical disc degeneration, unspecified cervical region: Secondary | ICD-10-CM | POA: Diagnosis not present

## 2023-11-14 DIAGNOSIS — M9901 Segmental and somatic dysfunction of cervical region: Secondary | ICD-10-CM | POA: Diagnosis not present

## 2023-11-14 DIAGNOSIS — J3089 Other allergic rhinitis: Secondary | ICD-10-CM | POA: Diagnosis not present

## 2023-11-14 DIAGNOSIS — J301 Allergic rhinitis due to pollen: Secondary | ICD-10-CM | POA: Diagnosis not present

## 2023-11-14 DIAGNOSIS — M9903 Segmental and somatic dysfunction of lumbar region: Secondary | ICD-10-CM | POA: Diagnosis not present

## 2023-11-21 DIAGNOSIS — J3081 Allergic rhinitis due to animal (cat) (dog) hair and dander: Secondary | ICD-10-CM | POA: Diagnosis not present

## 2023-11-21 DIAGNOSIS — J3089 Other allergic rhinitis: Secondary | ICD-10-CM | POA: Diagnosis not present

## 2023-11-21 DIAGNOSIS — M9903 Segmental and somatic dysfunction of lumbar region: Secondary | ICD-10-CM | POA: Diagnosis not present

## 2023-11-21 DIAGNOSIS — G4486 Cervicogenic headache: Secondary | ICD-10-CM | POA: Diagnosis not present

## 2023-11-21 DIAGNOSIS — M5030 Other cervical disc degeneration, unspecified cervical region: Secondary | ICD-10-CM | POA: Diagnosis not present

## 2023-11-21 DIAGNOSIS — M9901 Segmental and somatic dysfunction of cervical region: Secondary | ICD-10-CM | POA: Diagnosis not present

## 2023-11-21 DIAGNOSIS — J301 Allergic rhinitis due to pollen: Secondary | ICD-10-CM | POA: Diagnosis not present

## 2023-11-28 DIAGNOSIS — M5030 Other cervical disc degeneration, unspecified cervical region: Secondary | ICD-10-CM | POA: Diagnosis not present

## 2023-11-28 DIAGNOSIS — J3081 Allergic rhinitis due to animal (cat) (dog) hair and dander: Secondary | ICD-10-CM | POA: Diagnosis not present

## 2023-11-28 DIAGNOSIS — G4486 Cervicogenic headache: Secondary | ICD-10-CM | POA: Diagnosis not present

## 2023-11-28 DIAGNOSIS — M9901 Segmental and somatic dysfunction of cervical region: Secondary | ICD-10-CM | POA: Diagnosis not present

## 2023-11-28 DIAGNOSIS — J3089 Other allergic rhinitis: Secondary | ICD-10-CM | POA: Diagnosis not present

## 2023-11-28 DIAGNOSIS — M9903 Segmental and somatic dysfunction of lumbar region: Secondary | ICD-10-CM | POA: Diagnosis not present

## 2023-11-28 DIAGNOSIS — J301 Allergic rhinitis due to pollen: Secondary | ICD-10-CM | POA: Diagnosis not present

## 2023-12-05 DIAGNOSIS — J3089 Other allergic rhinitis: Secondary | ICD-10-CM | POA: Diagnosis not present

## 2023-12-05 DIAGNOSIS — G4486 Cervicogenic headache: Secondary | ICD-10-CM | POA: Diagnosis not present

## 2023-12-05 DIAGNOSIS — L578 Other skin changes due to chronic exposure to nonionizing radiation: Secondary | ICD-10-CM | POA: Diagnosis not present

## 2023-12-05 DIAGNOSIS — J301 Allergic rhinitis due to pollen: Secondary | ICD-10-CM | POA: Diagnosis not present

## 2023-12-05 DIAGNOSIS — J3081 Allergic rhinitis due to animal (cat) (dog) hair and dander: Secondary | ICD-10-CM | POA: Diagnosis not present

## 2023-12-05 DIAGNOSIS — M5030 Other cervical disc degeneration, unspecified cervical region: Secondary | ICD-10-CM | POA: Diagnosis not present

## 2023-12-05 DIAGNOSIS — M9901 Segmental and somatic dysfunction of cervical region: Secondary | ICD-10-CM | POA: Diagnosis not present

## 2023-12-05 DIAGNOSIS — M9903 Segmental and somatic dysfunction of lumbar region: Secondary | ICD-10-CM | POA: Diagnosis not present

## 2023-12-05 DIAGNOSIS — L723 Sebaceous cyst: Secondary | ICD-10-CM | POA: Diagnosis not present

## 2023-12-06 ENCOUNTER — Ambulatory Visit (INDEPENDENT_AMBULATORY_CARE_PROVIDER_SITE_OTHER): Admitting: Podiatry

## 2023-12-06 ENCOUNTER — Encounter: Payer: Self-pay | Admitting: Podiatry

## 2023-12-06 DIAGNOSIS — L600 Ingrowing nail: Secondary | ICD-10-CM

## 2023-12-06 NOTE — Progress Notes (Signed)
  Subjective:  Patient ID: Kristy Benton, female    DOB: June 17, 1980,   MRN: [id number]  Chief Complaint  Patient presents with   Nail Problem    I want her to check both my big toes today.  I dropped an Ipad on my left big toe.  Now, I have intermittent pain.  It's not hurting today.    43 y.o. female presents for cocnern of left great ingrown toenail that has been bothering her since February. Relates she did have it trimmed by pedicurist and has been doing well past two days and today not having any pain She relates she has been getting shooting pain in the toe and going down her foot. She does relates some lower back pain.   . Denies any other pedal complaints. Denies n/v/f/c.   Past Medical History:  Diagnosis Date   Depression    Uterine fibroid     Objective:  Physical Exam: Vascular: DP/PT pulses 2/4 bilateral. CFT <3 seconds. Normal hair growth on digits. No edema.  Skin. No lacerations or abrasions bilateral feet. Mild incurvation noted proximal medial border left hallux. No erythema edema or purulence noted. No pain to palpation Musculoskeletal: MMT 5/5 bilateral lower extremities in DF, PF, Inversion and Eversion. Deceased ROM in DF of ankle joint.  Neurological: Sensation intact to light touch.   Assessment:   1. Ingrown left greater toenail       Plan:  Patient was evaluated and treated and all questions answered. Discussed ingrown toenails etiology and treatment options including procedure for removal vs conservative care.  Patient hesitant to want to do a procedure today. Discussed in detail prevention of ingrowns and cutting as well as soaks.  Discussed evaluated of back pain as this could be related to her problem.  Patient to return in the future if needing procedure done.    Asberry Failing, DPM

## 2023-12-15 ENCOUNTER — Encounter: Payer: BC Managed Care – PPO | Admitting: Internal Medicine

## 2023-12-15 DIAGNOSIS — J301 Allergic rhinitis due to pollen: Secondary | ICD-10-CM | POA: Diagnosis not present

## 2023-12-15 DIAGNOSIS — J3089 Other allergic rhinitis: Secondary | ICD-10-CM | POA: Diagnosis not present

## 2023-12-19 ENCOUNTER — Ambulatory Visit: Admitting: Podiatry

## 2023-12-19 DIAGNOSIS — M9903 Segmental and somatic dysfunction of lumbar region: Secondary | ICD-10-CM | POA: Diagnosis not present

## 2023-12-19 DIAGNOSIS — M5030 Other cervical disc degeneration, unspecified cervical region: Secondary | ICD-10-CM | POA: Diagnosis not present

## 2023-12-19 DIAGNOSIS — G4486 Cervicogenic headache: Secondary | ICD-10-CM | POA: Diagnosis not present

## 2023-12-19 DIAGNOSIS — M9901 Segmental and somatic dysfunction of cervical region: Secondary | ICD-10-CM | POA: Diagnosis not present

## 2023-12-21 ENCOUNTER — Ambulatory Visit (INDEPENDENT_AMBULATORY_CARE_PROVIDER_SITE_OTHER): Admitting: Podiatry

## 2023-12-21 ENCOUNTER — Encounter: Payer: Self-pay | Admitting: Podiatry

## 2023-12-21 DIAGNOSIS — L603 Nail dystrophy: Secondary | ICD-10-CM

## 2023-12-21 NOTE — Progress Notes (Signed)
  Subjective:  Patient ID: Kristy Benton, female    DOB: January 11, 1981,   MRN: [id number]  Chief Complaint  Patient presents with   Nail Problem    I have acrylic on my toenail but my nail is lifting. (Hallux right)    43 y.o. female presents for concern of right hallux nail lifting up. Relates she has acrylic on the toenail and it is lifting. She does believe she may have dropped her Ipad on this toenail back in march. She was getting her toenails done and pedicurist suggest coming into be seen  . Denies any other pedal complaints. Denies n/v/f/c.   Past Medical History:  Diagnosis Date   Depression    Uterine fibroid     Objective:  Physical Exam: Vascular: DP/PT pulses 2/4 bilateral. CFT <3 seconds. Normal hair growth on digits. No edema.  Skin. No lacerations or abrasions bilateral feet. Right hallux nail thickened and covered in polish. Slighlty loose nail plate to distal nail bed.  Musculoskeletal: MMT 5/5 bilateral lower extremities in DF, PF, Inversion and Eversion. Deceased ROM in DF of ankle joint.  Neurological: Sensation intact to light touch.   Assessment:   1. Onychodystrophy      Plan:  Patient was evaluated and treated and all questions answered. -Examined patient -Discussed treatment options for painful dystrophic nails  -Discussed options including trimming back and removal of nail to prevent further injury. Patient hesitant and will just keep an eye on it. Advised on keeping bandage to protect nail from further damage. .   -Patient to return as needed   Asberry Failing, DPM

## 2023-12-22 DIAGNOSIS — J301 Allergic rhinitis due to pollen: Secondary | ICD-10-CM | POA: Diagnosis not present

## 2023-12-23 DIAGNOSIS — H5043 Accommodative component in esotropia: Secondary | ICD-10-CM | POA: Diagnosis not present

## 2023-12-23 DIAGNOSIS — J3089 Other allergic rhinitis: Secondary | ICD-10-CM | POA: Diagnosis not present

## 2023-12-23 DIAGNOSIS — J301 Allergic rhinitis due to pollen: Secondary | ICD-10-CM | POA: Diagnosis not present

## 2023-12-23 DIAGNOSIS — H532 Diplopia: Secondary | ICD-10-CM | POA: Diagnosis not present

## 2023-12-23 DIAGNOSIS — J3081 Allergic rhinitis due to animal (cat) (dog) hair and dander: Secondary | ICD-10-CM | POA: Diagnosis not present

## 2023-12-23 DIAGNOSIS — H5203 Hypermetropia, bilateral: Secondary | ICD-10-CM | POA: Diagnosis not present

## 2023-12-26 DIAGNOSIS — G4486 Cervicogenic headache: Secondary | ICD-10-CM | POA: Diagnosis not present

## 2023-12-26 DIAGNOSIS — M9903 Segmental and somatic dysfunction of lumbar region: Secondary | ICD-10-CM | POA: Diagnosis not present

## 2023-12-26 DIAGNOSIS — M9901 Segmental and somatic dysfunction of cervical region: Secondary | ICD-10-CM | POA: Diagnosis not present

## 2023-12-26 DIAGNOSIS — M5030 Other cervical disc degeneration, unspecified cervical region: Secondary | ICD-10-CM | POA: Diagnosis not present

## 2023-12-27 ENCOUNTER — Ambulatory Visit: Admitting: Podiatry

## 2024-01-02 DIAGNOSIS — M5030 Other cervical disc degeneration, unspecified cervical region: Secondary | ICD-10-CM | POA: Diagnosis not present

## 2024-01-02 DIAGNOSIS — G4486 Cervicogenic headache: Secondary | ICD-10-CM | POA: Diagnosis not present

## 2024-01-02 DIAGNOSIS — J301 Allergic rhinitis due to pollen: Secondary | ICD-10-CM | POA: Diagnosis not present

## 2024-01-02 DIAGNOSIS — M9903 Segmental and somatic dysfunction of lumbar region: Secondary | ICD-10-CM | POA: Diagnosis not present

## 2024-01-02 DIAGNOSIS — M9901 Segmental and somatic dysfunction of cervical region: Secondary | ICD-10-CM | POA: Diagnosis not present

## 2024-01-02 DIAGNOSIS — J3081 Allergic rhinitis due to animal (cat) (dog) hair and dander: Secondary | ICD-10-CM | POA: Diagnosis not present

## 2024-01-02 DIAGNOSIS — J3089 Other allergic rhinitis: Secondary | ICD-10-CM | POA: Diagnosis not present

## 2024-01-03 DIAGNOSIS — L570 Actinic keratosis: Secondary | ICD-10-CM | POA: Diagnosis not present

## 2024-01-03 DIAGNOSIS — D2262 Melanocytic nevi of left upper limb, including shoulder: Secondary | ICD-10-CM | POA: Diagnosis not present

## 2024-01-03 DIAGNOSIS — D2261 Melanocytic nevi of right upper limb, including shoulder: Secondary | ICD-10-CM | POA: Diagnosis not present

## 2024-01-03 DIAGNOSIS — D2272 Melanocytic nevi of left lower limb, including hip: Secondary | ICD-10-CM | POA: Diagnosis not present

## 2024-01-03 DIAGNOSIS — L814 Other melanin hyperpigmentation: Secondary | ICD-10-CM | POA: Diagnosis not present

## 2024-01-06 DIAGNOSIS — B3731 Acute candidiasis of vulva and vagina: Secondary | ICD-10-CM | POA: Diagnosis not present

## 2024-01-10 DIAGNOSIS — L720 Epidermal cyst: Secondary | ICD-10-CM | POA: Diagnosis not present

## 2024-01-10 DIAGNOSIS — M9903 Segmental and somatic dysfunction of lumbar region: Secondary | ICD-10-CM | POA: Diagnosis not present

## 2024-01-10 DIAGNOSIS — M5030 Other cervical disc degeneration, unspecified cervical region: Secondary | ICD-10-CM | POA: Diagnosis not present

## 2024-01-10 DIAGNOSIS — J3089 Other allergic rhinitis: Secondary | ICD-10-CM | POA: Diagnosis not present

## 2024-01-10 DIAGNOSIS — D225 Melanocytic nevi of trunk: Secondary | ICD-10-CM | POA: Diagnosis not present

## 2024-01-10 DIAGNOSIS — J3081 Allergic rhinitis due to animal (cat) (dog) hair and dander: Secondary | ICD-10-CM | POA: Diagnosis not present

## 2024-01-10 DIAGNOSIS — G4486 Cervicogenic headache: Secondary | ICD-10-CM | POA: Diagnosis not present

## 2024-01-10 DIAGNOSIS — J301 Allergic rhinitis due to pollen: Secondary | ICD-10-CM | POA: Diagnosis not present

## 2024-01-10 DIAGNOSIS — D485 Neoplasm of uncertain behavior of skin: Secondary | ICD-10-CM | POA: Diagnosis not present

## 2024-01-10 DIAGNOSIS — M9901 Segmental and somatic dysfunction of cervical region: Secondary | ICD-10-CM | POA: Diagnosis not present

## 2024-01-16 DIAGNOSIS — M5030 Other cervical disc degeneration, unspecified cervical region: Secondary | ICD-10-CM | POA: Diagnosis not present

## 2024-01-16 DIAGNOSIS — M9903 Segmental and somatic dysfunction of lumbar region: Secondary | ICD-10-CM | POA: Diagnosis not present

## 2024-01-16 DIAGNOSIS — G4486 Cervicogenic headache: Secondary | ICD-10-CM | POA: Diagnosis not present

## 2024-01-18 DIAGNOSIS — J3081 Allergic rhinitis due to animal (cat) (dog) hair and dander: Secondary | ICD-10-CM | POA: Diagnosis not present

## 2024-01-18 DIAGNOSIS — J3089 Other allergic rhinitis: Secondary | ICD-10-CM | POA: Diagnosis not present

## 2024-01-18 DIAGNOSIS — J301 Allergic rhinitis due to pollen: Secondary | ICD-10-CM | POA: Diagnosis not present

## 2024-01-23 DIAGNOSIS — M9903 Segmental and somatic dysfunction of lumbar region: Secondary | ICD-10-CM | POA: Diagnosis not present

## 2024-01-23 DIAGNOSIS — M9901 Segmental and somatic dysfunction of cervical region: Secondary | ICD-10-CM | POA: Diagnosis not present

## 2024-01-23 DIAGNOSIS — M5030 Other cervical disc degeneration, unspecified cervical region: Secondary | ICD-10-CM | POA: Diagnosis not present

## 2024-01-23 DIAGNOSIS — G4486 Cervicogenic headache: Secondary | ICD-10-CM | POA: Diagnosis not present

## 2024-01-27 DIAGNOSIS — J3081 Allergic rhinitis due to animal (cat) (dog) hair and dander: Secondary | ICD-10-CM | POA: Diagnosis not present

## 2024-01-27 DIAGNOSIS — J301 Allergic rhinitis due to pollen: Secondary | ICD-10-CM | POA: Diagnosis not present

## 2024-01-27 DIAGNOSIS — J3089 Other allergic rhinitis: Secondary | ICD-10-CM | POA: Diagnosis not present

## 2024-01-30 DIAGNOSIS — G4486 Cervicogenic headache: Secondary | ICD-10-CM | POA: Diagnosis not present

## 2024-01-30 DIAGNOSIS — M5030 Other cervical disc degeneration, unspecified cervical region: Secondary | ICD-10-CM | POA: Diagnosis not present

## 2024-01-30 DIAGNOSIS — M9903 Segmental and somatic dysfunction of lumbar region: Secondary | ICD-10-CM | POA: Diagnosis not present

## 2024-01-30 DIAGNOSIS — M9901 Segmental and somatic dysfunction of cervical region: Secondary | ICD-10-CM | POA: Diagnosis not present

## 2024-02-13 DIAGNOSIS — J301 Allergic rhinitis due to pollen: Secondary | ICD-10-CM | POA: Diagnosis not present

## 2024-02-13 DIAGNOSIS — J3089 Other allergic rhinitis: Secondary | ICD-10-CM | POA: Diagnosis not present

## 2024-02-13 DIAGNOSIS — J3081 Allergic rhinitis due to animal (cat) (dog) hair and dander: Secondary | ICD-10-CM | POA: Diagnosis not present

## 2024-02-15 DIAGNOSIS — L988 Other specified disorders of the skin and subcutaneous tissue: Secondary | ICD-10-CM | POA: Diagnosis not present

## 2024-02-23 DIAGNOSIS — M9901 Segmental and somatic dysfunction of cervical region: Secondary | ICD-10-CM | POA: Diagnosis not present

## 2024-02-23 DIAGNOSIS — J3081 Allergic rhinitis due to animal (cat) (dog) hair and dander: Secondary | ICD-10-CM | POA: Diagnosis not present

## 2024-02-23 DIAGNOSIS — G4486 Cervicogenic headache: Secondary | ICD-10-CM | POA: Diagnosis not present

## 2024-02-23 DIAGNOSIS — J3089 Other allergic rhinitis: Secondary | ICD-10-CM | POA: Diagnosis not present

## 2024-02-23 DIAGNOSIS — M5030 Other cervical disc degeneration, unspecified cervical region: Secondary | ICD-10-CM | POA: Diagnosis not present

## 2024-02-23 DIAGNOSIS — M9903 Segmental and somatic dysfunction of lumbar region: Secondary | ICD-10-CM | POA: Diagnosis not present

## 2024-02-23 DIAGNOSIS — J301 Allergic rhinitis due to pollen: Secondary | ICD-10-CM | POA: Diagnosis not present

## 2024-02-29 ENCOUNTER — Other Ambulatory Visit: Payer: Self-pay | Admitting: Internal Medicine

## 2024-02-29 DIAGNOSIS — M9903 Segmental and somatic dysfunction of lumbar region: Secondary | ICD-10-CM | POA: Diagnosis not present

## 2024-02-29 DIAGNOSIS — J3081 Allergic rhinitis due to animal (cat) (dog) hair and dander: Secondary | ICD-10-CM | POA: Diagnosis not present

## 2024-02-29 DIAGNOSIS — G4486 Cervicogenic headache: Secondary | ICD-10-CM | POA: Diagnosis not present

## 2024-02-29 DIAGNOSIS — J3089 Other allergic rhinitis: Secondary | ICD-10-CM | POA: Diagnosis not present

## 2024-02-29 DIAGNOSIS — M5030 Other cervical disc degeneration, unspecified cervical region: Secondary | ICD-10-CM | POA: Diagnosis not present

## 2024-02-29 DIAGNOSIS — J301 Allergic rhinitis due to pollen: Secondary | ICD-10-CM | POA: Diagnosis not present

## 2024-02-29 DIAGNOSIS — M9901 Segmental and somatic dysfunction of cervical region: Secondary | ICD-10-CM | POA: Diagnosis not present

## 2024-03-06 ENCOUNTER — Other Ambulatory Visit: Payer: Self-pay | Admitting: Internal Medicine

## 2024-03-06 DIAGNOSIS — J3089 Other allergic rhinitis: Secondary | ICD-10-CM | POA: Diagnosis not present

## 2024-03-06 DIAGNOSIS — J301 Allergic rhinitis due to pollen: Secondary | ICD-10-CM | POA: Diagnosis not present

## 2024-03-06 DIAGNOSIS — J3081 Allergic rhinitis due to animal (cat) (dog) hair and dander: Secondary | ICD-10-CM | POA: Diagnosis not present

## 2024-03-07 ENCOUNTER — Other Ambulatory Visit: Payer: Self-pay | Admitting: Internal Medicine

## 2024-03-07 DIAGNOSIS — M9901 Segmental and somatic dysfunction of cervical region: Secondary | ICD-10-CM | POA: Diagnosis not present

## 2024-03-07 DIAGNOSIS — M9903 Segmental and somatic dysfunction of lumbar region: Secondary | ICD-10-CM | POA: Diagnosis not present

## 2024-03-07 DIAGNOSIS — M5030 Other cervical disc degeneration, unspecified cervical region: Secondary | ICD-10-CM | POA: Diagnosis not present

## 2024-03-07 DIAGNOSIS — G4486 Cervicogenic headache: Secondary | ICD-10-CM | POA: Diagnosis not present

## 2024-04-09 ENCOUNTER — Other Ambulatory Visit: Payer: Self-pay | Admitting: Internal Medicine

## 2024-04-09 DIAGNOSIS — F419 Anxiety disorder, unspecified: Secondary | ICD-10-CM

## 2024-04-10 NOTE — Telephone Encounter (Signed)
 Please deny.  Last office visit was 01/18/2023.
